# Patient Record
Sex: Male | Born: 1955 | Race: White | Hispanic: No | Marital: Married | Smoking: Never smoker
Health system: Southern US, Community
[De-identification: ages and names within clinical notes are randomized; demographics above are authoritative.]

## PROBLEM LIST (undated history)

## (undated) DIAGNOSIS — I2510 Atherosclerotic heart disease of native coronary artery without angina pectoris: Secondary | ICD-10-CM

## (undated) DIAGNOSIS — R0602 Shortness of breath: Secondary | ICD-10-CM

## (undated) DIAGNOSIS — J479 Bronchiectasis, uncomplicated: Secondary | ICD-10-CM

## (undated) DIAGNOSIS — I219 Acute myocardial infarction, unspecified: Secondary | ICD-10-CM

## (undated) DIAGNOSIS — I209 Angina pectoris, unspecified: Secondary | ICD-10-CM

## (undated) DIAGNOSIS — I10 Essential (primary) hypertension: Secondary | ICD-10-CM

## (undated) DIAGNOSIS — K219 Gastro-esophageal reflux disease without esophagitis: Secondary | ICD-10-CM

## (undated) DIAGNOSIS — G4730 Sleep apnea, unspecified: Secondary | ICD-10-CM

## (undated) DIAGNOSIS — K449 Diaphragmatic hernia without obstruction or gangrene: Secondary | ICD-10-CM

## (undated) DIAGNOSIS — M1990 Unspecified osteoarthritis, unspecified site: Secondary | ICD-10-CM

## (undated) DIAGNOSIS — J449 Chronic obstructive pulmonary disease, unspecified: Secondary | ICD-10-CM

## (undated) DIAGNOSIS — K2270 Barrett's esophagus without dysplasia: Secondary | ICD-10-CM

## (undated) DIAGNOSIS — E785 Hyperlipidemia, unspecified: Secondary | ICD-10-CM

## (undated) DIAGNOSIS — J45909 Unspecified asthma, uncomplicated: Secondary | ICD-10-CM

## (undated) HISTORY — DX: Gastro-esophageal reflux disease without esophagitis: K21.9

## (undated) HISTORY — DX: Sleep apnea, unspecified: G47.30

## (undated) HISTORY — DX: Barrett's esophagus without dysplasia: K22.70

## (undated) HISTORY — DX: Unspecified asthma, uncomplicated: J45.909

## (undated) HISTORY — DX: Atherosclerotic heart disease of native coronary artery without angina pectoris: I25.10

## (undated) HISTORY — DX: Unspecified osteoarthritis, unspecified site: M19.90

## (undated) HISTORY — DX: Acute myocardial infarction, unspecified: I21.9

## (undated) HISTORY — DX: Angina pectoris, unspecified: I20.9

## (undated) HISTORY — PX: CARDIAC CATHETERIZATION: SHX172

## (undated) HISTORY — PX: BACK SURGERY: SHX140

## (undated) HISTORY — DX: Diaphragmatic hernia without obstruction or gangrene: K44.9

## (undated) HISTORY — DX: Chronic obstructive pulmonary disease, unspecified: J44.9

## (undated) HISTORY — DX: Shortness of breath: R06.02

## (undated) HISTORY — PX: FRACTURE SURGERY: SHX138

## (undated) HISTORY — DX: Essential (primary) hypertension: I10

## (undated) HISTORY — PX: OTHER SURGICAL HISTORY: SHX169

## (undated) HISTORY — DX: Hyperlipidemia, unspecified: E78.5

---

## 2001-10-15 ENCOUNTER — Ambulatory Visit (HOSPITAL_COMMUNITY): Admission: RE | Admit: 2001-10-15 | Discharge: 2001-10-15 | Payer: Self-pay | Admitting: *Deleted

## 2001-10-15 ENCOUNTER — Encounter: Payer: Self-pay | Admitting: *Deleted

## 2001-10-27 ENCOUNTER — Encounter: Payer: Self-pay | Admitting: Neurosurgery

## 2001-10-29 ENCOUNTER — Ambulatory Visit (HOSPITAL_COMMUNITY): Admission: RE | Admit: 2001-10-29 | Discharge: 2001-10-30 | Payer: Self-pay | Admitting: Neurosurgery

## 2001-10-29 ENCOUNTER — Encounter: Payer: Self-pay | Admitting: Neurosurgery

## 2002-07-29 ENCOUNTER — Ambulatory Visit (HOSPITAL_COMMUNITY): Admission: RE | Admit: 2002-07-29 | Discharge: 2002-07-29 | Payer: Self-pay | Admitting: Family Medicine

## 2002-07-29 ENCOUNTER — Encounter: Payer: Self-pay | Admitting: Family Medicine

## 2002-08-18 ENCOUNTER — Encounter: Payer: Self-pay | Admitting: Neurosurgery

## 2002-08-18 ENCOUNTER — Ambulatory Visit (HOSPITAL_COMMUNITY): Admission: RE | Admit: 2002-08-18 | Discharge: 2002-08-19 | Payer: Self-pay | Admitting: Neurosurgery

## 2016-12-17 DIAGNOSIS — G4733 Obstructive sleep apnea (adult) (pediatric): Secondary | ICD-10-CM | POA: Diagnosis not present

## 2016-12-17 DIAGNOSIS — J449 Chronic obstructive pulmonary disease, unspecified: Secondary | ICD-10-CM

## 2016-12-17 DIAGNOSIS — Z9989 Dependence on other enabling machines and devices: Secondary | ICD-10-CM | POA: Diagnosis not present

## 2016-12-17 DIAGNOSIS — J479 Bronchiectasis, uncomplicated: Secondary | ICD-10-CM | POA: Diagnosis not present

## 2016-12-17 DIAGNOSIS — R0602 Shortness of breath: Secondary | ICD-10-CM | POA: Diagnosis not present

## 2016-12-17 HISTORY — DX: Chronic obstructive pulmonary disease, unspecified: J44.9

## 2017-01-09 DIAGNOSIS — K219 Gastro-esophageal reflux disease without esophagitis: Secondary | ICD-10-CM | POA: Diagnosis not present

## 2017-01-09 DIAGNOSIS — K2270 Barrett's esophagus without dysplasia: Secondary | ICD-10-CM | POA: Diagnosis not present

## 2017-01-30 DIAGNOSIS — J479 Bronchiectasis, uncomplicated: Secondary | ICD-10-CM | POA: Diagnosis not present

## 2017-01-31 DIAGNOSIS — D126 Benign neoplasm of colon, unspecified: Secondary | ICD-10-CM | POA: Diagnosis not present

## 2017-01-31 DIAGNOSIS — K219 Gastro-esophageal reflux disease without esophagitis: Secondary | ICD-10-CM | POA: Diagnosis not present

## 2017-01-31 DIAGNOSIS — K2270 Barrett's esophagus without dysplasia: Secondary | ICD-10-CM | POA: Diagnosis not present

## 2017-02-05 DIAGNOSIS — R140 Abdominal distension (gaseous): Secondary | ICD-10-CM | POA: Diagnosis not present

## 2017-02-05 DIAGNOSIS — R1013 Epigastric pain: Secondary | ICD-10-CM | POA: Diagnosis not present

## 2017-02-12 DIAGNOSIS — E785 Hyperlipidemia, unspecified: Secondary | ICD-10-CM | POA: Diagnosis not present

## 2017-02-12 DIAGNOSIS — I10 Essential (primary) hypertension: Secondary | ICD-10-CM | POA: Diagnosis not present

## 2017-02-12 DIAGNOSIS — Z01818 Encounter for other preprocedural examination: Secondary | ICD-10-CM | POA: Diagnosis not present

## 2017-02-12 DIAGNOSIS — I2510 Atherosclerotic heart disease of native coronary artery without angina pectoris: Secondary | ICD-10-CM | POA: Diagnosis not present

## 2017-02-15 DIAGNOSIS — Z01818 Encounter for other preprocedural examination: Secondary | ICD-10-CM | POA: Diagnosis not present

## 2017-02-21 DIAGNOSIS — K2270 Barrett's esophagus without dysplasia: Secondary | ICD-10-CM | POA: Diagnosis not present

## 2017-02-21 DIAGNOSIS — K449 Diaphragmatic hernia without obstruction or gangrene: Secondary | ICD-10-CM | POA: Diagnosis not present

## 2017-02-21 DIAGNOSIS — K219 Gastro-esophageal reflux disease without esophagitis: Secondary | ICD-10-CM | POA: Diagnosis not present

## 2017-02-21 DIAGNOSIS — K2970 Gastritis, unspecified, without bleeding: Secondary | ICD-10-CM | POA: Diagnosis not present

## 2017-02-21 DIAGNOSIS — R109 Unspecified abdominal pain: Secondary | ICD-10-CM | POA: Diagnosis not present

## 2017-02-21 DIAGNOSIS — R140 Abdominal distension (gaseous): Secondary | ICD-10-CM | POA: Diagnosis not present

## 2017-03-13 DIAGNOSIS — K589 Irritable bowel syndrome without diarrhea: Secondary | ICD-10-CM | POA: Diagnosis not present

## 2017-03-13 DIAGNOSIS — E782 Mixed hyperlipidemia: Secondary | ICD-10-CM | POA: Diagnosis not present

## 2017-03-13 DIAGNOSIS — J449 Chronic obstructive pulmonary disease, unspecified: Secondary | ICD-10-CM | POA: Diagnosis not present

## 2017-03-13 DIAGNOSIS — G4730 Sleep apnea, unspecified: Secondary | ICD-10-CM | POA: Diagnosis not present

## 2017-03-13 DIAGNOSIS — E119 Type 2 diabetes mellitus without complications: Secondary | ICD-10-CM | POA: Diagnosis not present

## 2017-03-13 DIAGNOSIS — Q324 Other congenital malformations of bronchus: Secondary | ICD-10-CM | POA: Diagnosis not present

## 2017-03-13 DIAGNOSIS — M545 Low back pain: Secondary | ICD-10-CM | POA: Diagnosis not present

## 2017-03-13 DIAGNOSIS — E559 Vitamin D deficiency, unspecified: Secondary | ICD-10-CM | POA: Diagnosis not present

## 2017-03-13 DIAGNOSIS — Z125 Encounter for screening for malignant neoplasm of prostate: Secondary | ICD-10-CM | POA: Diagnosis not present

## 2017-03-13 DIAGNOSIS — J301 Allergic rhinitis due to pollen: Secondary | ICD-10-CM | POA: Diagnosis not present

## 2017-03-13 DIAGNOSIS — Z6829 Body mass index (BMI) 29.0-29.9, adult: Secondary | ICD-10-CM | POA: Diagnosis not present

## 2017-03-13 DIAGNOSIS — I2510 Atherosclerotic heart disease of native coronary artery without angina pectoris: Secondary | ICD-10-CM | POA: Diagnosis not present

## 2017-04-30 DIAGNOSIS — M546 Pain in thoracic spine: Secondary | ICD-10-CM | POA: Diagnosis not present

## 2017-04-30 DIAGNOSIS — Z981 Arthrodesis status: Secondary | ICD-10-CM | POA: Diagnosis not present

## 2017-04-30 DIAGNOSIS — Z6829 Body mass index (BMI) 29.0-29.9, adult: Secondary | ICD-10-CM | POA: Diagnosis not present

## 2017-04-30 DIAGNOSIS — M545 Low back pain: Secondary | ICD-10-CM | POA: Diagnosis not present

## 2017-04-30 DIAGNOSIS — E663 Overweight: Secondary | ICD-10-CM | POA: Diagnosis not present

## 2017-04-30 DIAGNOSIS — M47894 Other spondylosis, thoracic region: Secondary | ICD-10-CM | POA: Diagnosis not present

## 2017-07-22 DIAGNOSIS — K219 Gastro-esophageal reflux disease without esophagitis: Secondary | ICD-10-CM | POA: Diagnosis not present

## 2017-07-22 DIAGNOSIS — K2270 Barrett's esophagus without dysplasia: Secondary | ICD-10-CM | POA: Diagnosis not present

## 2017-07-30 DIAGNOSIS — J301 Allergic rhinitis due to pollen: Secondary | ICD-10-CM | POA: Diagnosis not present

## 2017-07-30 DIAGNOSIS — M545 Low back pain: Secondary | ICD-10-CM | POA: Diagnosis not present

## 2017-07-30 DIAGNOSIS — E119 Type 2 diabetes mellitus without complications: Secondary | ICD-10-CM | POA: Diagnosis not present

## 2017-07-30 DIAGNOSIS — Z6829 Body mass index (BMI) 29.0-29.9, adult: Secondary | ICD-10-CM | POA: Diagnosis not present

## 2017-07-30 DIAGNOSIS — E782 Mixed hyperlipidemia: Secondary | ICD-10-CM | POA: Diagnosis not present

## 2017-07-30 DIAGNOSIS — I2510 Atherosclerotic heart disease of native coronary artery without angina pectoris: Secondary | ICD-10-CM | POA: Diagnosis not present

## 2017-07-30 DIAGNOSIS — I10 Essential (primary) hypertension: Secondary | ICD-10-CM | POA: Diagnosis not present

## 2017-07-30 DIAGNOSIS — J45909 Unspecified asthma, uncomplicated: Secondary | ICD-10-CM | POA: Diagnosis not present

## 2017-08-08 DIAGNOSIS — R1011 Right upper quadrant pain: Secondary | ICD-10-CM | POA: Diagnosis not present

## 2017-08-21 DIAGNOSIS — K219 Gastro-esophageal reflux disease without esophagitis: Secondary | ICD-10-CM | POA: Diagnosis not present

## 2017-08-21 DIAGNOSIS — K2270 Barrett's esophagus without dysplasia: Secondary | ICD-10-CM | POA: Diagnosis not present

## 2017-08-29 ENCOUNTER — Other Ambulatory Visit: Payer: Self-pay | Admitting: Cardiology

## 2017-09-04 DIAGNOSIS — R109 Unspecified abdominal pain: Secondary | ICD-10-CM | POA: Diagnosis not present

## 2017-09-04 DIAGNOSIS — I700 Atherosclerosis of aorta: Secondary | ICD-10-CM | POA: Diagnosis not present

## 2017-09-18 DIAGNOSIS — K5900 Constipation, unspecified: Secondary | ICD-10-CM | POA: Diagnosis not present

## 2017-09-18 DIAGNOSIS — R1032 Left lower quadrant pain: Secondary | ICD-10-CM | POA: Diagnosis not present

## 2017-10-08 DIAGNOSIS — Z6830 Body mass index (BMI) 30.0-30.9, adult: Secondary | ICD-10-CM | POA: Diagnosis not present

## 2017-10-08 DIAGNOSIS — E782 Mixed hyperlipidemia: Secondary | ICD-10-CM | POA: Diagnosis not present

## 2017-10-08 DIAGNOSIS — J45909 Unspecified asthma, uncomplicated: Secondary | ICD-10-CM | POA: Diagnosis not present

## 2017-10-08 DIAGNOSIS — I2510 Atherosclerotic heart disease of native coronary artery without angina pectoris: Secondary | ICD-10-CM | POA: Diagnosis not present

## 2017-10-08 DIAGNOSIS — J189 Pneumonia, unspecified organism: Secondary | ICD-10-CM | POA: Diagnosis not present

## 2017-10-08 DIAGNOSIS — M545 Low back pain: Secondary | ICD-10-CM | POA: Diagnosis not present

## 2017-10-14 DIAGNOSIS — G4733 Obstructive sleep apnea (adult) (pediatric): Secondary | ICD-10-CM | POA: Diagnosis not present

## 2017-10-17 DIAGNOSIS — G4733 Obstructive sleep apnea (adult) (pediatric): Secondary | ICD-10-CM | POA: Diagnosis not present

## 2017-10-28 DIAGNOSIS — J470 Bronchiectasis with acute lower respiratory infection: Secondary | ICD-10-CM | POA: Diagnosis not present

## 2017-10-28 DIAGNOSIS — J449 Chronic obstructive pulmonary disease, unspecified: Secondary | ICD-10-CM | POA: Diagnosis not present

## 2017-10-28 DIAGNOSIS — Z9989 Dependence on other enabling machines and devices: Secondary | ICD-10-CM | POA: Diagnosis not present

## 2017-10-28 DIAGNOSIS — G4733 Obstructive sleep apnea (adult) (pediatric): Secondary | ICD-10-CM | POA: Diagnosis not present

## 2017-11-04 DIAGNOSIS — J471 Bronchiectasis with (acute) exacerbation: Secondary | ICD-10-CM | POA: Diagnosis not present

## 2017-11-04 DIAGNOSIS — R531 Weakness: Secondary | ICD-10-CM | POA: Diagnosis not present

## 2017-11-04 DIAGNOSIS — I700 Atherosclerosis of aorta: Secondary | ICD-10-CM | POA: Diagnosis not present

## 2017-11-04 DIAGNOSIS — R05 Cough: Secondary | ICD-10-CM | POA: Diagnosis not present

## 2017-11-04 DIAGNOSIS — I2510 Atherosclerotic heart disease of native coronary artery without angina pectoris: Secondary | ICD-10-CM | POA: Diagnosis not present

## 2017-11-04 DIAGNOSIS — Z8709 Personal history of other diseases of the respiratory system: Secondary | ICD-10-CM | POA: Diagnosis not present

## 2017-11-04 DIAGNOSIS — J441 Chronic obstructive pulmonary disease with (acute) exacerbation: Secondary | ICD-10-CM | POA: Diagnosis not present

## 2017-11-04 DIAGNOSIS — R0789 Other chest pain: Secondary | ICD-10-CM | POA: Diagnosis not present

## 2017-11-04 DIAGNOSIS — R0602 Shortness of breath: Secondary | ICD-10-CM | POA: Diagnosis not present

## 2017-11-04 DIAGNOSIS — R062 Wheezing: Secondary | ICD-10-CM | POA: Diagnosis not present

## 2017-11-04 DIAGNOSIS — R079 Chest pain, unspecified: Secondary | ICD-10-CM | POA: Diagnosis not present

## 2017-11-04 DIAGNOSIS — J9801 Acute bronchospasm: Secondary | ICD-10-CM | POA: Diagnosis not present

## 2017-11-04 DIAGNOSIS — I252 Old myocardial infarction: Secondary | ICD-10-CM | POA: Diagnosis not present

## 2017-11-04 DIAGNOSIS — R0600 Dyspnea, unspecified: Secondary | ICD-10-CM | POA: Diagnosis not present

## 2017-11-04 DIAGNOSIS — Z8679 Personal history of other diseases of the circulatory system: Secondary | ICD-10-CM | POA: Diagnosis not present

## 2017-11-11 DIAGNOSIS — J471 Bronchiectasis with (acute) exacerbation: Secondary | ICD-10-CM | POA: Diagnosis not present

## 2017-11-11 DIAGNOSIS — J9801 Acute bronchospasm: Secondary | ICD-10-CM | POA: Diagnosis not present

## 2017-11-11 DIAGNOSIS — J449 Chronic obstructive pulmonary disease, unspecified: Secondary | ICD-10-CM | POA: Diagnosis not present

## 2017-11-22 DIAGNOSIS — Z9989 Dependence on other enabling machines and devices: Secondary | ICD-10-CM | POA: Diagnosis not present

## 2017-11-22 DIAGNOSIS — G4733 Obstructive sleep apnea (adult) (pediatric): Secondary | ICD-10-CM | POA: Diagnosis not present

## 2017-11-22 DIAGNOSIS — J471 Bronchiectasis with (acute) exacerbation: Secondary | ICD-10-CM | POA: Diagnosis not present

## 2017-11-22 DIAGNOSIS — J449 Chronic obstructive pulmonary disease, unspecified: Secondary | ICD-10-CM | POA: Diagnosis not present

## 2017-12-12 DIAGNOSIS — J45909 Unspecified asthma, uncomplicated: Secondary | ICD-10-CM | POA: Diagnosis not present

## 2017-12-12 DIAGNOSIS — I2510 Atherosclerotic heart disease of native coronary artery without angina pectoris: Secondary | ICD-10-CM | POA: Diagnosis not present

## 2017-12-12 DIAGNOSIS — Z6830 Body mass index (BMI) 30.0-30.9, adult: Secondary | ICD-10-CM | POA: Diagnosis not present

## 2017-12-12 DIAGNOSIS — G4730 Sleep apnea, unspecified: Secondary | ICD-10-CM | POA: Diagnosis not present

## 2017-12-12 DIAGNOSIS — I10 Essential (primary) hypertension: Secondary | ICD-10-CM | POA: Diagnosis not present

## 2017-12-12 DIAGNOSIS — Q324 Other congenital malformations of bronchus: Secondary | ICD-10-CM | POA: Diagnosis not present

## 2017-12-12 DIAGNOSIS — E782 Mixed hyperlipidemia: Secondary | ICD-10-CM | POA: Diagnosis not present

## 2017-12-12 DIAGNOSIS — E669 Obesity, unspecified: Secondary | ICD-10-CM | POA: Diagnosis not present

## 2017-12-12 DIAGNOSIS — M545 Low back pain: Secondary | ICD-10-CM | POA: Diagnosis not present

## 2017-12-12 DIAGNOSIS — E119 Type 2 diabetes mellitus without complications: Secondary | ICD-10-CM | POA: Diagnosis not present

## 2017-12-12 DIAGNOSIS — Z79899 Other long term (current) drug therapy: Secondary | ICD-10-CM | POA: Diagnosis not present

## 2017-12-12 DIAGNOSIS — E559 Vitamin D deficiency, unspecified: Secondary | ICD-10-CM | POA: Diagnosis not present

## 2017-12-12 DIAGNOSIS — R079 Chest pain, unspecified: Secondary | ICD-10-CM | POA: Diagnosis not present

## 2017-12-17 ENCOUNTER — Ambulatory Visit: Payer: PPO | Admitting: Cardiology

## 2017-12-17 ENCOUNTER — Encounter: Payer: Self-pay | Admitting: Cardiology

## 2017-12-17 ENCOUNTER — Other Ambulatory Visit: Payer: Self-pay

## 2017-12-17 ENCOUNTER — Ambulatory Visit (HOSPITAL_BASED_OUTPATIENT_CLINIC_OR_DEPARTMENT_OTHER)
Admission: RE | Admit: 2017-12-17 | Discharge: 2017-12-17 | Disposition: A | Discharge status: Home / Self Care | Payer: PPO | Source: Ambulatory Visit | Attending: Cardiology | Admitting: Cardiology

## 2017-12-17 VITALS — BP 136/82 | HR 76 | Ht 72.0 in | Wt 221.1 lb

## 2017-12-17 DIAGNOSIS — M1990 Unspecified osteoarthritis, unspecified site: Secondary | ICD-10-CM | POA: Diagnosis not present

## 2017-12-17 DIAGNOSIS — G4730 Sleep apnea, unspecified: Secondary | ICD-10-CM | POA: Insufficient documentation

## 2017-12-17 DIAGNOSIS — I219 Acute myocardial infarction, unspecified: Secondary | ICD-10-CM | POA: Diagnosis not present

## 2017-12-17 DIAGNOSIS — I209 Angina pectoris, unspecified: Secondary | ICD-10-CM

## 2017-12-17 DIAGNOSIS — M5134 Other intervertebral disc degeneration, thoracic region: Secondary | ICD-10-CM | POA: Diagnosis not present

## 2017-12-17 DIAGNOSIS — I25119 Atherosclerotic heart disease of native coronary artery with unspecified angina pectoris: Secondary | ICD-10-CM | POA: Diagnosis not present

## 2017-12-17 DIAGNOSIS — J45909 Unspecified asthma, uncomplicated: Secondary | ICD-10-CM | POA: Diagnosis not present

## 2017-12-17 DIAGNOSIS — J449 Chronic obstructive pulmonary disease, unspecified: Secondary | ICD-10-CM | POA: Diagnosis not present

## 2017-12-17 DIAGNOSIS — R0602 Shortness of breath: Secondary | ICD-10-CM | POA: Diagnosis not present

## 2017-12-17 DIAGNOSIS — I10 Essential (primary) hypertension: Secondary | ICD-10-CM | POA: Diagnosis not present

## 2017-12-17 DIAGNOSIS — Z01818 Encounter for other preprocedural examination: Secondary | ICD-10-CM | POA: Diagnosis not present

## 2017-12-17 DIAGNOSIS — K2270 Barrett's esophagus without dysplasia: Secondary | ICD-10-CM | POA: Insufficient documentation

## 2017-12-17 DIAGNOSIS — E785 Hyperlipidemia, unspecified: Secondary | ICD-10-CM | POA: Diagnosis not present

## 2017-12-17 DIAGNOSIS — I700 Atherosclerosis of aorta: Secondary | ICD-10-CM | POA: Insufficient documentation

## 2017-12-17 DIAGNOSIS — I252 Old myocardial infarction: Secondary | ICD-10-CM | POA: Insufficient documentation

## 2017-12-17 DIAGNOSIS — I2510 Atherosclerotic heart disease of native coronary artery without angina pectoris: Secondary | ICD-10-CM | POA: Diagnosis not present

## 2017-12-17 DIAGNOSIS — I200 Unstable angina: Secondary | ICD-10-CM | POA: Insufficient documentation

## 2017-12-17 DIAGNOSIS — K449 Diaphragmatic hernia without obstruction or gangrene: Secondary | ICD-10-CM | POA: Insufficient documentation

## 2017-12-17 NOTE — Patient Instructions (Addendum)
Medication Instructions:  Your physician recommends that you continue on your current medications as directed. Please refer to the Current Medication list given to you today.  Labwork: Your physician recommends that you have the following labs drawn: CBC, BMP and PT/INR  Testing/Procedures: A chest x-ray takes a picture of the organs and structures inside the chest, including the heart, lungs, and blood vessels. This test can show several things, including, whether the heart is enlarges; whether fluid is building up in the lungs; and whether pacemaker / defibrillator leads are still in place.    [place] [address] [place] [address] Dept: [phone] Loc: [place]  12/17/2017  You are scheduled for a Cardiac Catheterization on Friday, January 11 with Dr. Peter Martinique.  1. Please arrive at the [facility] (Main Entrance A) at [facility]: [address] [place], [place] [address] at 8:00 AM (two hours before your procedure to ensure your preparation). Free valet parking service is available.   Special note: Every effort is made to have your procedure done on time. Please understand that emergencies sometimes delay scheduled procedures.  2. Diet: Do not eat or drink anything after midnight prior to your procedure except sips of water to take medications.  3. Labs: Done today  4. Medication instructions in preparation for your procedure:  On the morning of your procedure, take your Aspirin and any morning medicines NOT listed above.  You may use sips of water.  5. Plan for one night stay--bring personal belongings. 6. Bring a current list of your medications and current insurance cards. 7. You MUST have a responsible person to drive you home. 8. Someone MUST be with you the first 24 hours after you arrive home or your discharge will be delayed. 9. Please  wear clothes that are easy to get on and off and wear slip-on shoes.  Thank you for allowing [place] to care for you!   -- [facility]   Follow-Up: Your physician recommends that you schedule a follow-up appointment in: 1 month  Any Other Special Instructions Will Be Listed Below (If Applicable).     If you need a refill on your cardiac medications before your next appointment, please call your pharmacy.   [place], RN, BSN    Coronary Angiogram With Stent Coronary angiogram with stent placement is a procedure to widen or open a narrow blood vessel of the heart (coronary artery). Arteries may become blocked by cholesterol buildup (plaques) in the lining of the wall. When a coronary artery becomes partially blocked, blood flow to that area decreases. This may lead to chest pain or a heart attack (myocardial infarction). A stent is a small piece of metal that looks like mesh or a spring. Stent placement may be done as treatment for a heart attack or right after a coronary angiogram in which a blocked artery is found. Let your health care provider know about:  Any allergies you have.  All medicines you are taking, including vitamins, herbs, eye drops, creams, and over-the-counter medicines.  Any problems you or family members have had with anesthetic medicines.  Any blood disorders you have.  Any surgeries you have had.  Any medical conditions you have.  Whether you are pregnant or may be pregnant. What are the risks? Generally, this is a safe procedure. However, problems may occur, including:  Damage to the heart or its blood vessels.  A return of blockage.  Bleeding, infection, or bruising at the insertion site.  A collection of blood under the skin (hematoma) at the insertion site.  A blood clot in another part of the body.  Kidney injury.  Allergic reaction to the dye or contrast that is used.  Bleeding into the  abdomen (retroperitoneal bleeding).  What happens before the procedure? Staying hydrated Follow instructions from your health care provider about hydration, which may include:  Up to 2 hours before the procedure - you may continue to drink clear liquids, such as water, clear fruit juice, black coffee, and plain tea.  Eating and drinking restrictions Follow instructions from your health care provider about eating and drinking, which may include:  8 hours before the procedure - stop eating heavy meals or foods such as meat, fried foods, or fatty foods.  6 hours before the procedure - stop eating light meals or foods, such as toast or cereal.  2 hours before the procedure - stop drinking clear liquids.  Ask your health care provider about:  Changing or stopping your regular medicines. This is especially important if you are taking diabetes medicines or blood thinners.  Taking medicines such as ibuprofen. These medicines can thin your blood. Do not take these medicines before your procedure if your health care provider instructs you not to. Generally, aspirin is recommended before a procedure of passing a small, thin tube (catheter) through a blood vessel and into the heart (cardiac catheterization).  What happens during the procedure?  An IV tube will be inserted into one of your veins.  You will be given one or more of the following: ? A medicine to help you relax (sedative). ? A medicine to numb the area where the catheter will be inserted into an artery (local anesthetic).  To reduce your risk of infection: ? Your health care team will wash or sanitize their hands. ? Your skin will be washed with soap. ? Hair may be removed from the area where the catheter will be inserted.  Using a guide wire, the catheter will be inserted into an artery. The location may be in your groin, in your wrist, or in the fold of your arm (near your elbow).  A type of X-ray (fluoroscopy) will be used  to help guide the catheter to the opening of the arteries in the heart.  A dye will be injected into the catheter, and X-rays will be taken. The dye will help to show where any narrowing or blockages are located in the arteries.  A tiny wire will be guided to the blocked spot, and a balloon will be inflated to make the artery wider.  The stent will be expanded and will crush the plaques into the wall of the vessel. The stent will hold the area open and improve the blood flow. Most stents have a drug coating to reduce the risk of the stent narrowing over time.  The artery may be made wider using a drill, laser, or other tools to remove plaques.  When the blood flow is better, the catheter will be removed. The lining of the artery will grow over the stent, which stays where it was placed. This procedure may vary among health care providers and hospitals. What happens after the procedure?  If the procedure is done through the leg, you will be kept in bed lying flat for about 6 hours. You will be instructed to not bend  and not cross your legs.  The insertion site will be checked frequently.  The pulse in your foot or wrist will be checked frequently.  You may have additional blood tests, X-rays, and a test that records the electrical activity of your heart (electrocardiogram, or ECG). This information is not intended to replace advice given to you by your health care provider. Make sure you discuss any questions you have with your health care provider. Document Released: 06/02/2003 Document Revised: 07/26/2016 Document Reviewed: 07/01/2016 Elsevier Interactive Patient Education  [employer].

## 2017-12-17 NOTE — H&P (View-Only) (Signed)
Cardiology Office Note:    Date:  12/17/2017   ID:  Nicholas Love, DOB 1955-12-26, MRN [id number]  PCP:  Ocie Doyne., MD  Cardiologist:  Jenean Lindau, MD   Referring MD: Ocie Doyne., MD    ASSESSMENT:    1. Angina pectoris ([place])   2. Arthritis   3. Asthma, unspecified asthma severity, unspecified whether complicated, unspecified whether persistent   4. Chronic obstructive pulmonary disease, unspecified COPD type ([place])   5. Hyperlipidemia, unspecified hyperlipidemia type   6. Hypertension, unspecified type   7. Myocardial infarction, unspecified MI type, unspecified artery ([place])   8. Sleep apnea, unspecified type   9. SOB (shortness of breath)   10. Coronary artery disease, angina presence unspecified, unspecified vessel or lesion type, unspecified whether native or transplanted heart    PLAN:    In order of problems listed above:  1. I discussed my findings with the patient at extensive length and secondary prevention stressed. 2. Sublingual nitroglycerin prescription was sent, its protocol and 911 protocol explained and the patient vocalized understanding questions were answered to the patient's satisfaction 3. I discussed coronary angiography and left heart catheterization with the patient at extensive length. Procedure, benefits and potential risks were explained. Patient had multiple questions which were answered to the patient's satisfaction. Patient agreed and consented for the procedure. Further recommendations will be made based on the findings of the coronary angiography. In the interim. The patient has any significant symptoms he knows to go to the nearest emergency room. 4. Lipids are followed by primary care physician.  His blood pressure is stable.  He will be seen in follow-up appointment after coronary angiography.   Medication Adjustments/Labs and Tests Ordered: Current medicines are reviewed at length with the patient today.  Concerns regarding medicines  are outlined above.  Orders Placed This Encounter  Procedures  . EKG 12-Lead   No orders of the defined types were placed in this encounter.    History of Present Illness:    Nicholas Love is a 62 y.o. male who is being seen today for the evaluation of chest pain suggesting angina pectoris at the request of Ocie Doyne., MD.  Patient is a pleasant 62 year old male.  He has past medical history of coronary artery disease and bronchiectasis.  He mentions to me that he had chest tightness recently on 2 or 3 occasions which was relieved with sublingual nitroglycerin.  I reviewed the entire coronary angiography report from 2017.  He underwent coronary stenting and was found to have a borderline stenosis in the right coronary artery and medical treatment was recommended.  Subsequently he has done fine until recently when he started experiencing chest tightness.  This radiates to the neck.  No orthopnea or PND.  It occurs with exertion.  At the time of my evaluation, the patient is alert awake oriented and in no distress.  The patient is accompanied by his wife for this visit.  Past Medical History:  Diagnosis Date  . Angina pectoris ([place])   . Arthritis   . Asthma   . Barrett esophagus   . CAD (coronary artery disease)   . COPD (chronic obstructive pulmonary disease) ([place])   . GERD (gastroesophageal reflux disease)   . Hiatal hernia   . Hyperlipemia   . Hypertension   . Myocardial infarct ([place])   . Sleep apnea   . SOB (shortness of breath)      Current Medications: Current Meds  Medication Sig  .  albuterol (PROVENTIL) (2.5 MG/3ML) 0.083% nebulizer solution Take 3 mLs by nebulization every 4 (four) hours as needed.  Marland Kitchen aspirin EC 81 MG tablet Take 1 tablet by mouth daily.  Marland Kitchen BREO ELLIPTA 100-25 MCG/INH AEPB   . clopidogrel (PLAVIX) 75 MG tablet Take 1 tablet by mouth daily.  Marland Kitchen losartan-hydrochlorothiazide (HYZAAR) 100-12.5 MG tablet Take 1 tablet by mouth daily.  . metoprolol  succinate (TOPROL-XL) 100 MG 24 hr tablet Take 1 tablet by mouth daily.  . nitroGLYCERIN (NITROSTAT) 0.4 MG SL tablet 1 TABLET UNDER THE TONGUE AS NEEDED FOR CHEST PAIN UP TO 3 TABS, IF NO RESULT CALL 911  . oxyCODONE (OXYCONTIN) 10 mg 12 hr tablet Take 1 tablet by mouth every 8 (eight) hours as needed.  . pantoprazole (PROTONIX) 40 MG tablet Take 1 tablet by mouth daily.  . rosuvastatin (CRESTOR) 40 MG tablet Take 1 tablet by mouth daily.  Marland Kitchen SPIRIVA RESPIMAT 1.25 MCG/ACT AERS   . ST JOHNS WORT PO Take by mouth.  Marland Kitchen tiZANidine (ZANAFLEX) 4 MG tablet Take by mouth.  . vitamin B-12 (CYANOCOBALAMIN) 1000 MCG tablet Take 1,000 mcg by mouth daily.     Allergies:   Ceftriaxone; Cefdinir; and Ciprofloxacin   Social History   Socioeconomic History  . Marital status: Married    Spouse name: None  . Number of children: None  . Years of education: None  . Highest education level: None  Social Needs  . Financial resource strain: None  . Food insecurity - worry: None  . Food insecurity - inability: None  . Transportation needs - medical: None  . Transportation needs - non-medical: None  Occupational History  . None  Tobacco Use  . Smoking status: None  Substance and Sexual Activity  . Alcohol use: None  . Drug use: None  . Sexual activity: None  Other Topics Concern  . None  Social History Narrative  . None     Family History: The patient's family history is not on file.  ROS:   Please see the history of present illness.    All other systems reviewed and are negative.  EKGs/Labs/Other Studies Reviewed:    The following studies were reviewed today: I reviewed coronary angiography report 2017.  EKG donerevealed sinus rhythm and nonspecific ST-T changes   Recent Labs: No results found for requested labs within last 8760 hours.  Recent Lipid Panel No results found for: CHOL, TRIG, HDL, CHOLHDL, VLDL, LDLCALC, LDLDIRECT  Physical Exam:    VS:  BP 136/82 (BP Location: Right  Arm, Patient Position: Sitting, Cuff Size: Large)   Pulse 76   Ht 6' (1.829 m)   Wt 221 lb 1.3 oz (100.3 kg)   SpO2 95%   BMI 29.98 kg/m     Wt Readings from Last 3 Encounters:  12/17/17 221 lb 1.3 oz (100.3 kg)     GEN: Patient is in no acute distress HEENT: Normal NECK: No JVD; No carotid bruits LYMPHATICS: No lymphadenopathy CARDIAC: S1 S2 regular, 2/6 systolic murmur at the apex. RESPIRATORY:  Clear to auscultation without rales, wheezing or rhonchi  ABDOMEN: Soft, non-tender, non-distended MUSCULOSKELETAL:  No edema; No deformity  SKIN: Warm and dry NEUROLOGIC:  Alert and oriented x 3 PSYCHIATRIC:  Normal affect    Signed, Jenean Lindau, MD  12/17/2017 10:38 AM    [place]

## 2017-12-17 NOTE — Progress Notes (Signed)
Cardiology Office Note:    Date:  12/17/2017   ID:  Nicholas Love, DOB 16-May-1956, MRN [id number]  PCP:  Ocie Doyne., MD  Cardiologist:  Jenean Lindau, MD   Referring MD: Ocie Doyne., MD    ASSESSMENT:    1. Angina pectoris ([place])   2. Arthritis   3. Asthma, unspecified asthma severity, unspecified whether complicated, unspecified whether persistent   4. Chronic obstructive pulmonary disease, unspecified COPD type ([place])   5. Hyperlipidemia, unspecified hyperlipidemia type   6. Hypertension, unspecified type   7. Myocardial infarction, unspecified MI type, unspecified artery ([place])   8. Sleep apnea, unspecified type   9. SOB (shortness of breath)   10. Coronary artery disease, angina presence unspecified, unspecified vessel or lesion type, unspecified whether native or transplanted heart    PLAN:    In order of problems listed above:  1. I discussed my findings with the patient at extensive length and secondary prevention stressed. 2. Sublingual nitroglycerin prescription was sent, its protocol and 911 protocol explained and the patient vocalized understanding questions were answered to the patient's satisfaction 3. I discussed coronary angiography and left heart catheterization with the patient at extensive length. Procedure, benefits and potential risks were explained. Patient had multiple questions which were answered to the patient's satisfaction. Patient agreed and consented for the procedure. Further recommendations will be made based on the findings of the coronary angiography. In the interim. The patient has any significant symptoms he knows to go to the nearest emergency room. 4. Lipids are followed by primary care physician.  His blood pressure is stable.  He will be seen in follow-up appointment after coronary angiography.   Medication Adjustments/Labs and Tests Ordered: Current medicines are reviewed at length with the patient today.  Concerns regarding medicines  are outlined above.  Orders Placed This Encounter  Procedures  . EKG 12-Lead   No orders of the defined types were placed in this encounter.    History of Present Illness:    Nicholas Love is a 62 y.o. male who is being seen today for the evaluation of chest pain suggesting angina pectoris at the request of Ocie Doyne., MD.  Patient is a pleasant 62 year old male.  He has past medical history of coronary artery disease and bronchiectasis.  He mentions to me that he had chest tightness recently on 2 or 3 occasions which was relieved with sublingual nitroglycerin.  I reviewed the entire coronary angiography report from 2017.  He underwent coronary stenting and was found to have a borderline stenosis in the right coronary artery and medical treatment was recommended.  Subsequently he has done fine until recently when he started experiencing chest tightness.  This radiates to the neck.  No orthopnea or PND.  It occurs with exertion.  At the time of my evaluation, the patient is alert awake oriented and in no distress.  The patient is accompanied by his wife for this visit.  Past Medical History:  Diagnosis Date  . Angina pectoris ([place])   . Arthritis   . Asthma   . Barrett esophagus   . CAD (coronary artery disease)   . COPD (chronic obstructive pulmonary disease) ([place])   . GERD (gastroesophageal reflux disease)   . Hiatal hernia   . Hyperlipemia   . Hypertension   . Myocardial infarct ([place])   . Sleep apnea   . SOB (shortness of breath)      Current Medications: Current Meds  Medication Sig  .  albuterol (PROVENTIL) (2.5 MG/3ML) 0.083% nebulizer solution Take 3 mLs by nebulization every 4 (four) hours as needed.  Marland Kitchen aspirin EC 81 MG tablet Take 1 tablet by mouth daily.  Marland Kitchen BREO ELLIPTA 100-25 MCG/INH AEPB   . clopidogrel (PLAVIX) 75 MG tablet Take 1 tablet by mouth daily.  Marland Kitchen losartan-hydrochlorothiazide (HYZAAR) 100-12.5 MG tablet Take 1 tablet by mouth daily.  . metoprolol  succinate (TOPROL-XL) 100 MG 24 hr tablet Take 1 tablet by mouth daily.  . nitroGLYCERIN (NITROSTAT) 0.4 MG SL tablet 1 TABLET UNDER THE TONGUE AS NEEDED FOR CHEST PAIN UP TO 3 TABS, IF NO RESULT CALL 911  . oxyCODONE (OXYCONTIN) 10 mg 12 hr tablet Take 1 tablet by mouth every 8 (eight) hours as needed.  . pantoprazole (PROTONIX) 40 MG tablet Take 1 tablet by mouth daily.  . rosuvastatin (CRESTOR) 40 MG tablet Take 1 tablet by mouth daily.  Marland Kitchen SPIRIVA RESPIMAT 1.25 MCG/ACT AERS   . ST JOHNS WORT PO Take by mouth.  Marland Kitchen tiZANidine (ZANAFLEX) 4 MG tablet Take by mouth.  . vitamin B-12 (CYANOCOBALAMIN) 1000 MCG tablet Take 1,000 mcg by mouth daily.     Allergies:   Ceftriaxone; Cefdinir; and Ciprofloxacin   Social History   Socioeconomic History  . Marital status: Married    Spouse name: None  . Number of children: None  . Years of education: None  . Highest education level: None  Social Needs  . Financial resource strain: None  . Food insecurity - worry: None  . Food insecurity - inability: None  . Transportation needs - medical: None  . Transportation needs - non-medical: None  Occupational History  . None  Tobacco Use  . Smoking status: None  Substance and Sexual Activity  . Alcohol use: None  . Drug use: None  . Sexual activity: None  Other Topics Concern  . None  Social History Narrative  . None     Family History: The patient's family history is not on file.  ROS:   Please see the history of present illness.    All other systems reviewed and are negative.  EKGs/Labs/Other Studies Reviewed:    The following studies were reviewed today: I reviewed coronary angiography report 2017.  EKG donerevealed sinus rhythm and nonspecific ST-T changes   Recent Labs: No results found for requested labs within last 8760 hours.  Recent Lipid Panel No results found for: CHOL, TRIG, HDL, CHOLHDL, VLDL, LDLCALC, LDLDIRECT  Physical Exam:    VS:  BP 136/82 (BP Location: Right  Arm, Patient Position: Sitting, Cuff Size: Large)   Pulse 76   Ht 6' (1.829 m)   Wt 221 lb 1.3 oz (100.3 kg)   SpO2 95%   BMI 29.98 kg/m     Wt Readings from Last 3 Encounters:  12/17/17 221 lb 1.3 oz (100.3 kg)     GEN: Patient is in no acute distress HEENT: Normal NECK: No JVD; No carotid bruits LYMPHATICS: No lymphadenopathy CARDIAC: S1 S2 regular, 2/6 systolic murmur at the apex. RESPIRATORY:  Clear to auscultation without rales, wheezing or rhonchi  ABDOMEN: Soft, non-tender, non-distended MUSCULOSKELETAL:  No edema; No deformity  SKIN: Warm and dry NEUROLOGIC:  Alert and oriented x 3 PSYCHIATRIC:  Normal affect    Signed, Jenean Lindau, MD  12/17/2017 10:38 AM    [place]

## 2017-12-18 ENCOUNTER — Telehealth: Payer: Self-pay

## 2017-12-18 LAB — BASIC METABOLIC PANEL
BUN / CREAT RATIO: 23 (ref 10–24)
BUN: 24 mg/dL (ref 8–27)
CALCIUM: 9.8 mg/dL (ref 8.6–10.2)
CHLORIDE: 101 mmol/L (ref 96–106)
CO2: 26 mmol/L (ref 20–29)
Creatinine, Ser: 1.06 mg/dL (ref 0.76–1.27)
GFR calc non Af Amer: 75 mL/min/{1.73_m2} (ref >59)
GFR, EST AFRICAN AMERICAN: 87 mL/min/{1.73_m2} (ref >59)
Glucose: 106 mg/dL — ABNORMAL HIGH (ref 65–99)
POTASSIUM: 4.9 mmol/L (ref 3.5–5.2)
Sodium: 143 mmol/L (ref 134–144)

## 2017-12-18 LAB — PROTIME-INR
INR: 1 (ref 0.8–1.2)
Prothrombin Time: 10.6 s (ref 9.1–12.0)

## 2017-12-18 NOTE — Telephone Encounter (Signed)
Patient's wife called to inquire about the heart cath. Information regarding prep and post were given.

## 2017-12-19 ENCOUNTER — Encounter: Payer: Self-pay | Admitting: Cardiology

## 2017-12-19 ENCOUNTER — Telehealth: Payer: Self-pay

## 2017-12-19 NOTE — Telephone Encounter (Signed)
Patient contacted pre-catheterization at [facility] scheduled for:  12/20/2017 @ 1030 Verified arrival time and place:  [facility] @ 0800 Confirmed AM meds to be taken pre-cath with sip of water: Take ASA/Plavix Hold losartan/hctz Confirmed patient has responsible person to drive home post procedure and observe patient for 24 hours:  yes Addl concerns:  none

## 2017-12-20 ENCOUNTER — Encounter (HOSPITAL_COMMUNITY)
Admission: RE | Disposition: A | Discharge status: Home / Self Care | Payer: Self-pay | Source: Ambulatory Visit | Attending: Cardiology

## 2017-12-20 ENCOUNTER — Observation Stay (HOSPITAL_COMMUNITY)
Admission: RE | Admit: 2017-12-20 | Discharge: 2017-12-21 | Disposition: A | Discharge status: Home / Self Care | Payer: PPO | Source: Ambulatory Visit | Attending: Cardiology | Admitting: Cardiology

## 2017-12-20 ENCOUNTER — Encounter (HOSPITAL_COMMUNITY): Payer: Self-pay | Admitting: Physician Assistant

## 2017-12-20 DIAGNOSIS — I2510 Atherosclerotic heart disease of native coronary artery without angina pectoris: Secondary | ICD-10-CM | POA: Diagnosis not present

## 2017-12-20 DIAGNOSIS — I252 Old myocardial infarction: Secondary | ICD-10-CM | POA: Insufficient documentation

## 2017-12-20 DIAGNOSIS — G4730 Sleep apnea, unspecified: Secondary | ICD-10-CM | POA: Insufficient documentation

## 2017-12-20 DIAGNOSIS — K2270 Barrett's esophagus without dysplasia: Secondary | ICD-10-CM | POA: Insufficient documentation

## 2017-12-20 DIAGNOSIS — M1990 Unspecified osteoarthritis, unspecified site: Secondary | ICD-10-CM | POA: Diagnosis not present

## 2017-12-20 DIAGNOSIS — R0602 Shortness of breath: Secondary | ICD-10-CM | POA: Insufficient documentation

## 2017-12-20 DIAGNOSIS — I25119 Atherosclerotic heart disease of native coronary artery with unspecified angina pectoris: Secondary | ICD-10-CM | POA: Insufficient documentation

## 2017-12-20 DIAGNOSIS — Z79899 Other long term (current) drug therapy: Secondary | ICD-10-CM | POA: Insufficient documentation

## 2017-12-20 DIAGNOSIS — I200 Unstable angina: Secondary | ICD-10-CM | POA: Diagnosis present

## 2017-12-20 DIAGNOSIS — J449 Chronic obstructive pulmonary disease, unspecified: Secondary | ICD-10-CM | POA: Diagnosis not present

## 2017-12-20 DIAGNOSIS — Z7982 Long term (current) use of aspirin: Secondary | ICD-10-CM | POA: Diagnosis not present

## 2017-12-20 DIAGNOSIS — Z955 Presence of coronary angioplasty implant and graft: Secondary | ICD-10-CM

## 2017-12-20 DIAGNOSIS — I25110 Atherosclerotic heart disease of native coronary artery with unstable angina pectoris: Secondary | ICD-10-CM

## 2017-12-20 DIAGNOSIS — K219 Gastro-esophageal reflux disease without esophagitis: Secondary | ICD-10-CM | POA: Insufficient documentation

## 2017-12-20 DIAGNOSIS — E785 Hyperlipidemia, unspecified: Secondary | ICD-10-CM | POA: Insufficient documentation

## 2017-12-20 DIAGNOSIS — J45909 Unspecified asthma, uncomplicated: Secondary | ICD-10-CM | POA: Diagnosis not present

## 2017-12-20 DIAGNOSIS — Z7902 Long term (current) use of antithrombotics/antiplatelets: Secondary | ICD-10-CM | POA: Diagnosis not present

## 2017-12-20 DIAGNOSIS — I10 Essential (primary) hypertension: Secondary | ICD-10-CM | POA: Insufficient documentation

## 2017-12-20 DIAGNOSIS — Z881 Allergy status to other antibiotic agents status: Secondary | ICD-10-CM | POA: Diagnosis not present

## 2017-12-20 HISTORY — PX: CORONARY STENT INTERVENTION: CATH118234

## 2017-12-20 HISTORY — DX: Bronchiectasis, uncomplicated: J47.9

## 2017-12-20 HISTORY — PX: LEFT HEART CATH AND CORONARY ANGIOGRAPHY: CATH118249

## 2017-12-20 LAB — CBC
HCT: 45.3 % (ref 39.0–52.0)
Hemoglobin: 14.5 g/dL (ref 13.0–17.0)
MCH: 28.3 pg (ref 26.0–34.0)
MCHC: 32 g/dL (ref 30.0–36.0)
MCV: 88.3 fL (ref 78.0–100.0)
PLATELETS: 170 10*3/uL (ref 150–400)
RBC: 5.13 MIL/uL (ref 4.22–5.81)
RDW: 14.2 % (ref 11.5–15.5)
WBC: 8.2 10*3/uL (ref 4.0–10.5)

## 2017-12-20 LAB — TROPONIN I

## 2017-12-20 LAB — POCT ACTIVATED CLOTTING TIME
Activated Clotting Time: 257 seconds
Activated Clotting Time: 279 seconds

## 2017-12-20 SURGERY — LEFT HEART CATH AND CORONARY ANGIOGRAPHY
Anesthesia: LOCAL

## 2017-12-20 MED ORDER — NITROGLYCERIN 1 MG/10 ML FOR IR/CATH LAB
INTRA_ARTERIAL | Status: DC | PRN
  Administered 2017-12-20 (×3): 200 ug via INTRACORONARY

## 2017-12-20 MED ORDER — HYDROCHLOROTHIAZIDE 12.5 MG PO CAPS: 12.5000 mg | ORAL_CAPSULE | Freq: Every day | ORAL | Status: DC

## 2017-12-20 MED ORDER — SODIUM CHLORIDE 0.9 % IV SOLN: 250.0000 mL | INTRAVENOUS | Status: DC | PRN

## 2017-12-20 MED ORDER — ADENOSINE 12 MG/4ML IV SOLN
INTRAVENOUS | Status: AC
  Filled 2017-12-20: qty 12

## 2017-12-20 MED ORDER — MIDAZOLAM HCL 2 MG/2ML IJ SOLN
INTRAMUSCULAR | Status: AC
  Filled 2017-12-20: qty 2

## 2017-12-20 MED ORDER — ANGIOPLASTY BOOK
Freq: Once | Status: AC
  Administered 2017-12-20: 23:00:00
  Filled 2017-12-20: qty 1

## 2017-12-20 MED ORDER — LOSARTAN POTASSIUM-HCTZ 100-12.5 MG PO TABS: 1.0000 | ORAL_TABLET | Freq: Every day | ORAL | Status: DC

## 2017-12-20 MED ORDER — IOPAMIDOL (ISOVUE-370) INJECTION 76%
INTRAVENOUS | Status: AC
  Filled 2017-12-20: qty 100

## 2017-12-20 MED ORDER — ACETAMINOPHEN 325 MG PO TABS: 650.0000 mg | ORAL_TABLET | ORAL | Status: DC | PRN

## 2017-12-20 MED ORDER — OXYCODONE HCL 5 MG PO TABS
ORAL_TABLET | ORAL | Status: AC
  Filled 2017-12-20: qty 2

## 2017-12-20 MED ORDER — HYDRALAZINE HCL 20 MG/ML IJ SOLN: 10.0000 mg | Freq: Four times a day (QID) | INTRAMUSCULAR | Status: DC | PRN

## 2017-12-20 MED ORDER — ONDANSETRON HCL 4 MG/2ML IJ SOLN: 4.0000 mg | Freq: Four times a day (QID) | INTRAMUSCULAR | Status: DC | PRN

## 2017-12-20 MED ORDER — HYDROCHLOROTHIAZIDE 12.5 MG PO CAPS
12.5000 mg | ORAL_CAPSULE | Freq: Once | ORAL | Status: AC
  Administered 2017-12-20: 12.5 mg via ORAL
  Filled 2017-12-20: qty 1

## 2017-12-20 MED ORDER — SODIUM CHLORIDE 0.9% FLUSH: 3.0000 mL | INTRAVENOUS | Status: DC | PRN

## 2017-12-20 MED ORDER — VERAPAMIL HCL 2.5 MG/ML IV SOLN
INTRAVENOUS | Status: AC
  Filled 2017-12-20: qty 2

## 2017-12-20 MED ORDER — VITAMIN B-12 1000 MCG PO TABS
2000.0000 ug | ORAL_TABLET | Freq: Every day | ORAL | Status: DC
  Administered 2017-12-20: 2000 ug via ORAL
  Filled 2017-12-20: qty 2

## 2017-12-20 MED ORDER — NITROGLYCERIN 0.4 MG SL SUBL: 0.4000 mg | SUBLINGUAL_TABLET | SUBLINGUAL | Status: DC | PRN

## 2017-12-20 MED ORDER — ADENOSINE 12 MG/4ML IV SOLN
INTRAVENOUS | Status: AC
  Filled 2017-12-20: qty 4

## 2017-12-20 MED ORDER — PANTOPRAZOLE SODIUM 40 MG PO TBEC: 40.0000 mg | DELAYED_RELEASE_TABLET | Freq: Every day | ORAL | Status: DC

## 2017-12-20 MED ORDER — FENTANYL CITRATE (PF) 100 MCG/2ML IJ SOLN
INTRAMUSCULAR | Status: DC | PRN
  Administered 2017-12-20 (×3): 25 ug via INTRAVENOUS

## 2017-12-20 MED ORDER — SODIUM CHLORIDE 0.9% FLUSH
3.0000 mL | Freq: Two times a day (BID) | INTRAVENOUS | Status: DC
  Administered 2017-12-20: 3 mL via INTRAVENOUS

## 2017-12-20 MED ORDER — TIOTROPIUM BROMIDE MONOHYDRATE 18 MCG IN CAPS
18.0000 ug | ORAL_CAPSULE | Freq: Every day | RESPIRATORY_TRACT | Status: DC
  Filled 2017-12-20: qty 5

## 2017-12-20 MED ORDER — IOPAMIDOL (ISOVUE-300) INJECTION 61%
INTRAVENOUS | Status: AC
  Filled 2017-12-20: qty 50

## 2017-12-20 MED ORDER — OXYCODONE HCL 5 MG PO TABS
10.0000 mg | ORAL_TABLET | Freq: Four times a day (QID) | ORAL | Status: DC | PRN
  Administered 2017-12-20: 10 mg via ORAL

## 2017-12-20 MED ORDER — ASPIRIN EC 81 MG PO TBEC: 81.0000 mg | DELAYED_RELEASE_TABLET | Freq: Every evening | ORAL | Status: DC

## 2017-12-20 MED ORDER — SODIUM CHLORIDE 0.9 % WEIGHT BASED INFUSION
3.0000 mL/kg/h | INTRAVENOUS | Status: DC
  Administered 2017-12-20: 3 mL/kg/h via INTRAVENOUS

## 2017-12-20 MED ORDER — LIDOCAINE HCL (PF) 1 % IJ SOLN
INTRAMUSCULAR | Status: DC | PRN
  Administered 2017-12-20: 2 mL

## 2017-12-20 MED ORDER — CLOPIDOGREL BISULFATE 75 MG PO TABS: 75.0000 mg | ORAL_TABLET | Freq: Every evening | ORAL | Status: DC

## 2017-12-20 MED ORDER — LIDOCAINE HCL (PF) 1 % IJ SOLN
INTRAMUSCULAR | Status: AC
  Filled 2017-12-20: qty 30

## 2017-12-20 MED ORDER — LOSARTAN POTASSIUM 50 MG PO TABS: 100.0000 mg | ORAL_TABLET | Freq: Every day | ORAL | Status: DC

## 2017-12-20 MED ORDER — SODIUM CHLORIDE 0.9% FLUSH
3.0000 mL | Freq: Two times a day (BID) | INTRAVENOUS | Status: DC
  Administered 2017-12-20: 23:00:00 3 mL via INTRAVENOUS

## 2017-12-20 MED ORDER — SODIUM CHLORIDE 0.9 % WEIGHT BASED INFUSION
1.0000 mL/kg/h | INTRAVENOUS | Status: DC
  Administered 2017-12-20: 1 mL/kg/h via INTRAVENOUS

## 2017-12-20 MED ORDER — METOPROLOL SUCCINATE ER 100 MG PO TB24
100.0000 mg | ORAL_TABLET | Freq: Every day | ORAL | Status: DC
  Filled 2017-12-20: qty 1

## 2017-12-20 MED ORDER — ASPIRIN 81 MG PO CHEW: 81.0000 mg | CHEWABLE_TABLET | ORAL | Status: DC

## 2017-12-20 MED ORDER — HEPARIN (PORCINE) IN NACL 2-0.9 UNIT/ML-% IJ SOLN
INTRAMUSCULAR | Status: AC | PRN
  Administered 2017-12-20: 1000 mL via INTRA_ARTERIAL

## 2017-12-20 MED ORDER — TIZANIDINE HCL 4 MG PO TABS
4.0000 mg | ORAL_TABLET | Freq: Every day | ORAL | Status: DC
Start: 2017-12-20 — End: 2017-12-21
  Administered 2017-12-20: 4 mg via ORAL
  Filled 2017-12-20: qty 1

## 2017-12-20 MED ORDER — HEPARIN SODIUM (PORCINE) 1000 UNIT/ML IJ SOLN
INTRAMUSCULAR | Status: DC | PRN
  Administered 2017-12-20: 5000 [IU] via INTRAVENOUS
  Administered 2017-12-20: 4000 [IU] via INTRAVENOUS
  Administered 2017-12-20: 3000 [IU] via INTRAVENOUS

## 2017-12-20 MED ORDER — HEPARIN SODIUM (PORCINE) 1000 UNIT/ML IJ SOLN
INTRAMUSCULAR | Status: AC
  Filled 2017-12-20: qty 1

## 2017-12-20 MED ORDER — VERAPAMIL HCL 2.5 MG/ML IV SOLN
INTRAVENOUS | Status: DC | PRN
  Administered 2017-12-20: 10 mL via INTRA_ARTERIAL

## 2017-12-20 MED ORDER — ROSUVASTATIN CALCIUM 40 MG PO TABS
40.0000 mg | ORAL_TABLET | Freq: Every evening | ORAL | Status: DC
  Administered 2017-12-20: 40 mg via ORAL
  Filled 2017-12-20: qty 1

## 2017-12-20 MED ORDER — HEPARIN (PORCINE) IN NACL 2-0.9 UNIT/ML-% IJ SOLN
INTRAMUSCULAR | Status: AC
  Filled 2017-12-20: qty 1000

## 2017-12-20 MED ORDER — FLUTICASONE FUROATE-VILANTEROL 100-25 MCG/INH IN AEPB
1.0000 | INHALATION_SPRAY | Freq: Every day | RESPIRATORY_TRACT | Status: DC
  Filled 2017-12-20: qty 28

## 2017-12-20 MED ORDER — LOSARTAN POTASSIUM 50 MG PO TABS
100.0000 mg | ORAL_TABLET | Freq: Once | ORAL | Status: AC
  Administered 2017-12-20: 100 mg via ORAL
  Filled 2017-12-20: qty 2

## 2017-12-20 MED ORDER — FENTANYL CITRATE (PF) 100 MCG/2ML IJ SOLN
INTRAMUSCULAR | Status: AC
  Filled 2017-12-20: qty 2

## 2017-12-20 MED ORDER — MIDAZOLAM HCL 2 MG/2ML IJ SOLN
INTRAMUSCULAR | Status: DC | PRN
  Administered 2017-12-20 (×3): 1 mg via INTRAVENOUS

## 2017-12-20 MED ORDER — SODIUM CHLORIDE 0.9 % WEIGHT BASED INFUSION: 1.0000 mL/kg/h | INTRAVENOUS | Status: AC

## 2017-12-20 MED ORDER — ADENOSINE (DIAGNOSTIC) 140MCG/KG/MIN
INTRAVENOUS | Status: AC | PRN
  Administered 2017-12-20: 140 ug/kg/min via INTRAVENOUS

## 2017-12-20 MED ORDER — CLOPIDOGREL BISULFATE 75 MG PO TABS: 75.0000 mg | ORAL_TABLET | ORAL | Status: DC

## 2017-12-20 MED ORDER — IOPAMIDOL (ISOVUE-370) INJECTION 76%
INTRAVENOUS | Status: DC | PRN
  Administered 2017-12-20: 140 mL via INTRA_ARTERIAL

## 2017-12-20 MED ORDER — HEPARIN (PORCINE) IN NACL 2-0.9 UNIT/ML-% IJ SOLN
INTRAMUSCULAR | Status: AC
  Filled 2017-12-20: qty 500

## 2017-12-20 MED ORDER — SODIUM CHLORIDE 0.9% FLUSH: 3.0000 mL | Freq: Two times a day (BID) | INTRAVENOUS | Status: DC

## 2017-12-20 MED ORDER — NITROGLYCERIN 1 MG/10 ML FOR IR/CATH LAB
INTRA_ARTERIAL | Status: AC
  Filled 2017-12-20: qty 10

## 2017-12-20 SURGICAL SUPPLY — 22 items
BALLN SAPPHIRE 2.5X15 (BALLOONS) ×2
BALLN SAPPHIRE ~~LOC~~ 4.0X15 (BALLOONS) ×2 IMPLANT
BALLOON SAPPHIRE 2.5X15 (BALLOONS) ×1 IMPLANT
CATH IMPULSE 5F ANG/FL3.5 (CATHETERS) ×2 IMPLANT
CATH LAUNCHER 6FR AL.75 (CATHETERS) ×2 IMPLANT
CATH MICROCATH NAVVUS (MICROCATHETER) ×1 IMPLANT
CATH VISTA GUIDE 6FR 3DRC (CATHETERS) ×2 IMPLANT
DEVICE RAD COMP TR BAND LRG (VASCULAR PRODUCTS) ×2 IMPLANT
GLIDESHEATH SLEND SS 6F .021 (SHEATH) ×2 IMPLANT
GUIDELINER 6F (CATHETERS) ×2 IMPLANT
GUIDEWIRE INQWIRE 1.5J.035X260 (WIRE) ×2 IMPLANT
GUIDEWIRE PRESSURE COMET II (WIRE) ×2 IMPLANT
INQWIRE 1.5J .035X260CM (WIRE) ×4
KIT ENCORE 26 ADVANTAGE (KITS) ×2 IMPLANT
KIT HEART LEFT (KITS) ×2 IMPLANT
MICROCATHETER NAVVUS (MICROCATHETER) ×2
PACK CARDIAC CATHETERIZATION (CUSTOM PROCEDURE TRAY) ×2 IMPLANT
STENT SIERRA 4.00 X 23 MM (Permanent Stent) ×2 IMPLANT
SYR MEDRAD MARK V 150ML (SYRINGE) ×2 IMPLANT
TRANSDUCER W/STOPCOCK (MISCELLANEOUS) ×2 IMPLANT
TUBING CIL FLEX 10 FLL-RA (TUBING) ×2 IMPLANT
WIRE HI TORQ WHISPER MS 190CM (WIRE) ×2 IMPLANT

## 2017-12-20 NOTE — Progress Notes (Signed)
Pt suddenly states he feels odd, face tingling, just does not feel well,  bp elevated to 163/107 p 67, cuff was readjusted and bp was then 177/88 p 65. Denies chest pain. No change in radial site. Cecilie Kicks PA was called and updated, EKG was ordered and completed. Continue to monitor.

## 2017-12-20 NOTE — Discharge Summary (Signed)
Discharge Summary    Patient ID: Nicholas Love,  MRN: [id number], DOB/AGE: May 18, 1956 62 y.o.  Admit date: 12/20/2017 Discharge date: 12/21/2017  Primary Care Provider: Ocie Doyne Primary Cardiologist: Dr. Geraldo Pitter  Discharge Diagnoses    Principal Problem:   Unstable angina [facility]) Active Problems:   COPD (chronic obstructive pulmonary disease) (HCC)   Hyperlipemia   Hypertension   CAD (coronary artery disease)  Diagnostic Studies/Procedures    Conclusion     Dist RCA lesion is 75% stenosed.  Mid RCA lesion is 20% stenosed.  Prox RCA to Mid RCA lesion is 20% stenosed.  Ost 1st Diag lesion is 100% stenosed.  Prox LAD-1 lesion is 30% stenosed.  Previously placed Prox LAD-2 stent (unknown type) is widely patent.  Prox LAD to Mid LAD lesion is 30% stenosed.  The left ventricular systolic function is normal.  LV end diastolic pressure is normal.  The left ventricular ejection fraction is 55-65% by visual estimate.  Post intervention, there is a 0% residual stenosis.  A drug-eluting stent was successfully placed using a STENT SIERRA 4.00 X 23 MM.   1. 2 vessel obstructive CAD.    - 100% first diagonal with left to left collaterals.    - 75% distal RCA with abnormal FFR of 0.65 2. Continued patency of stents in the mid RCA x 2 and mid LAD 3. Normal LV function 4. Normal LVEDP 5. Successful stenting of the distal RCA with DES.   Plan: continue DAPT for at least one year. Anticipate same day discharge if stable.      _____________     History of Present Illness     Nicholas Love is a 62 y.o. male with history of CAD (prior mild RCAx2 and LAD stenting), bronchiectasis, arthritis, asthma, COPD, GERD, hiatal hernia, HTN, hyperlipidemia, sleep apnea who presented to [facility] for planned cath. Dr. Ervin Knack note indicates prior history of coronary stenting in 2017. We do not have access to that prior report but cath note today indicated previously placed stents  in mid RCA x 2 and mid LAD. Most recently he presented back to the office with symptoms concerning for recurrent angina, thus cath was recommended.   Hospital Course    He presented for this procedure today which demonstrated 75% distal RCA stenosis with abnormal FFR of 0.65 which was treated with a drug-eluting stent. He also had 100% ostial D1 with left to left collaterals, patent prior stents in the mid RCA x 2 and mid LAD, and nonobstructive disease in mid RCA (20%), prox LAD (30%), prox-mid LAD (30%). This also showed normal LV function, normal LVEDP. It was recommended he continue DAPT for at least one year. He was on ASA and Plavix prior to admission so no changes were made. Dr. Martinique felt he would be eligible for same-day PCI if stable. Troponins were trended for potential participation in Optimize trial (not for any particular complication or post-cath symptom). He was seen by cardiac rehab and felt to do well. I will be signing out to my colleague to reassess patient at 1640. Of note, he was asked to discontinue St. John's wort due to drug interactions (particularly with statin) and avoid phenylephrine nasal spray due to his CAD. Close follow-up has been arranged with Dr. Lennox Pippins.   Same day discharge on 1/11 was cancelled secondary to oozing from the right radial cath site. Patient's blood pressure was also noted to be elevated prior to potential discharge. It was recommended he be  admitted to 6 C for overnight observation. Overnight, he did not have any acute issues. Right radial cath site did not have any further issues and oozing stopped. BP has significantly improved. NO further chest pain. He has ambulated and is ready to go home.   His right radial cath site has been assessed and no further bleeding is noted. He has 2+ radial pulse. No bruising noted. He has been seen by Dr. Bronson Ing and felt to be stable for discharge. He was already on DAPT with ASA and Plavix prior to admission, these  will be continued upon discharge.  _____________  Discharge Vitals Blood pressure (!) 147/77, pulse 74, temperature 98.3 F (36.8 C), temperature source Oral, resp. rate 13, height 6' (1.829 m), weight 216 lb 7.9 oz (98.2 kg), SpO2 96 %.  Filed Weights   12/20/17 0809 12/21/17 0438  Weight: 221 lb (100.2 kg) 216 lb 7.9 oz (98.2 kg)    Labs & Radiologic Studies    CBC Recent Labs    12/20/17 0831 12/21/17 0342  WBC 8.2 9.7  HGB 14.5 14.2  HCT 45.3 43.1  MCV 88.3 88.3  PLT 170 166   Cardiac Enzymes Recent Labs    12/20/17 1044 12/20/17 1606  TROPONINI <0.03 <0.03  _____________  Dg Chest 2 View  Result Date: 12/17/2017 CLINICAL DATA:  Several month history of shortness of breath. Preoperative study prior to cardiac catheterization. History of asthma-COPD, smoker, coronary artery disease with previous MI. EXAM: CHEST  2 VIEW COMPARISON:  Report of a chest x-ray dated October 27, 2001. FINDINGS: The lungs are adequately inflated. There is no focal infiltrate. There is no pleural effusion. The heart and pulmonary vascularity are normal. There calcification in the wall of the aortic arch. The mediastinum is normal in width. There is no pleural effusion. There is multilevel degenerative disc disease of the thoracic spine. IMPRESSION: There is no active cardiopulmonary disease. Thoracic aortic atherosclerosis. Electronically Signed   By: David  Martinique M.D.   On: 12/17/2017 15:39   Disposition   Pt is being discharged home today in good condition.  Follow-up Plans & Appointments    Follow-up Information    Revankar, Reita Cliche, MD Follow up.   Specialty:  Cardiology Why:  [place] office - 12/30/17 at 4pm. Arrive 15 minutes prior to appointment to check in. Contact information: [place] [address] [phone]          Discharge Instructions    AMB Referral to Phase II Cardiac Rehab   Complete by:  As directed     Diagnosis:  Coronary Stents   Amb Referral to Cardiac Rehabilitation   Complete by:  As directed    Referring to [facility] Phase 2 CRP   Diagnosis:  Coronary Stents   Call MD for:  difficulty breathing, headache or visual disturbances   Complete by:  As directed    Call MD for:  extreme fatigue   Complete by:  As directed    Call MD for:  hives   Complete by:  As directed    Call MD for:  persistant dizziness or light-headedness   Complete by:  As directed    Call MD for:  persistant nausea and vomiting   Complete by:  As directed    Call MD for:  redness, tenderness, or signs of infection (pain, swelling, redness, odor or green/yellow discharge around incision site)   Complete by:  As directed  Call MD for:  severe uncontrolled pain   Complete by:  As directed    Call MD for:  temperature >100.4   Complete by:  As directed    Diet - low sodium heart healthy   Complete by:  As directed    Diet - low sodium heart healthy   Complete by:  As directed    Discharge instructions   Complete by:  As directed    No driving for 2 days. No lifting over 5 lbs for 1 week. No sexual activity for 1 week. Keep procedure site clean & dry. If you notice increased pain, swelling, bleeding or pus, call/return!  You may shower, but no soaking baths/hot tubs/pools for 1 week.  St. John's wort can interact with multiple medications including your statin. Would recommend you hold this for now and discuss with your primary care doctor and cardiologist.  Nasal phenylephrine is not advised for patients with heart disease, so this was stopped from your med list.   Increase activity slowly   Complete by:  As directed    Increase activity slowly   Complete by:  As directed       Discharge Medications   Allergies as of 12/21/2017      Reactions   Ceftriaxone Anaphylaxis   Cefdinir Rash   Ciprofloxacin Rash          Medication List    STOP taking these medications   SINEX REGULAR NA   St  Johns Wort 300 MG Tabs     TAKE these medications   albuterol (2.5 MG/3ML) 0.083% nebulizer solution Commonly known as:  PROVENTIL Take 3 mLs by nebulization every 4 (four) hours as needed for shortness of breath   aspirin EC 81 MG tablet Take 81 mg by mouth every evening.   BREO ELLIPTA 100-25 MCG/INH Aepb Generic drug:  fluticasone furoate-vilanterol Inhale 1 puff into the lungs daily.   clopidogrel 75 MG tablet Commonly known as:  PLAVIX Take 75 mg by mouth every evening.   diphenhydrAMINE 25 MG tablet Commonly known as:  BENADRYL Take 50 mg by mouth at bedtime as needed for allergies.   Glucosamine Chondroitin Triple Tabs Take 1 tablet by mouth at bedtime.   losartan-hydrochlorothiazide 100-12.5 MG tablet Commonly known as:  HYZAAR Take 1 tablet by mouth daily.   metoprolol succinate 100 MG 24 hr tablet Commonly known as:  TOPROL-XL Take 100 mg by mouth daily.   nitroGLYCERIN 0.4 MG SL tablet Commonly known as:  NITROSTAT 1 TABLET UNDER THE TONGUE AS NEEDED FOR CHEST PAIN UP TO 3 TABS, IF NO RESULT CALL 911   Oxycodone HCl 10 MG Tabs Take 10 mg by mouth every 6 (six) hours as needed (pain).   pantoprazole 40 MG tablet Commonly known as:  PROTONIX Take 40 mg by mouth daily.   rosuvastatin 40 MG tablet Commonly known as:  CRESTOR Take 40 mg by mouth every evening.   SPIRIVA RESPIMAT 2.5 MCG/ACT Aers Generic drug:  Tiotropium Bromide Monohydrate Inhale 2 puffs into the lungs daily.   tiZANidine 4 MG tablet Commonly known as:  ZANAFLEX Take 4 mg by mouth at bedtime.   vitamin B-12 1000 MCG tablet Commonly known as:  CYANOCOBALAMIN Take 2,000 mcg by mouth daily.        Allergies:  Allergies  Allergen Reactions  . Ceftriaxone Anaphylaxis  . Cefdinir Rash  . Ciprofloxacin Rash     Outstanding Labs/Studies   N/A  Duration of Discharge Encounter  Greater than 30 minutes including physician time.  Signed, Christell Faith PA-C 12/21/2017, 8:11  AM

## 2017-12-20 NOTE — Research (Signed)
OPTIMZIE Informed Consent   Subject Name: Nicholas Love  Subject met inclusion and exclusion criteria.  The informed consent form, study requirements and expectations were reviewed with the subject and questions and concerns were addressed prior to the signing of the consent form.  The subject verbalized understanding of the trail requirements.  The subject agreed to participate in the OPTIMIZE trial and signed the informed consent.  The informed consent was obtained prior to performance of any protocol-specific procedures for the subject.  A copy of the signed informed consent was given to the subject and a copy was placed in the subject's medical record. Only applicable if randomized.  [name] 12/20/2017, [id number]

## 2017-12-20 NOTE — Progress Notes (Signed)
Pt is feeling well, better than before admit but his Rt radial sit continues to ooze.  Discussed with Dr. Martinique and best to keep overnight obs on 6 C --plan d/c in AM.  Pt agreeable.

## 2017-12-20 NOTE — Progress Notes (Signed)
Patient received from short stay at 2050. Ambulating to bathroom with steady gait. Telemetry SR 90, right radial site dressing with old drainage noted, level 0 at this time with no active bleeding, no bruising or hematoma noted. Patient denies chest pain or shortness of breath. Report given to Rennis Harding RN.

## 2017-12-20 NOTE — Interval H&P Note (Signed)
History and Physical Interval Note:  12/20/2017 9:08 AM  Nicholas Love  has presented today for surgery, with the diagnosis of angina  The various methods of treatment have been discussed with the patient and family. After consideration of risks, benefits and other options for treatment, the patient has consented to  Procedure(s): LEFT HEART CATH AND CORONARY ANGIOGRAPHY (N/A) as a surgical intervention .  The patient's history has been reviewed, patient examined, no change in status, stable for surgery.  I have reviewed the patient's chart and labs.  Questions were answered to the patient's satisfaction.   Cath Lab Visit (complete for each Cath Lab visit)  Clinical Evaluation Leading to the Procedure:   ACS: Yes.    Non-ACS:    Anginal Classification: CCS III  Anti-ischemic medical therapy: Maximal Therapy (2 or more classes of medications)  Non-Invasive Test Results: No non-invasive testing performed  Prior CABG: No previous CABG       Nicholas Love [place] 12/20/2017 9:08 AM

## 2017-12-20 NOTE — Progress Notes (Signed)
[id number] Education completed with pt who voiced understanding. Stressed importance of plavix with stent. Reviewed NTG use, ex ed, heart healthy food choices and CRP 2. Will refer to [place] Phase 2.  Graylon Good RN BSN 12/20/2017 1:50 PM

## 2017-12-21 ENCOUNTER — Encounter (HOSPITAL_COMMUNITY): Payer: Self-pay | Admitting: Cardiology

## 2017-12-21 ENCOUNTER — Other Ambulatory Visit: Payer: Self-pay

## 2017-12-21 DIAGNOSIS — I25118 Atherosclerotic heart disease of native coronary artery with other forms of angina pectoris: Secondary | ICD-10-CM | POA: Diagnosis not present

## 2017-12-21 DIAGNOSIS — G4733 Obstructive sleep apnea (adult) (pediatric): Secondary | ICD-10-CM

## 2017-12-21 DIAGNOSIS — Z9989 Dependence on other enabling machines and devices: Secondary | ICD-10-CM | POA: Diagnosis not present

## 2017-12-21 DIAGNOSIS — Z955 Presence of coronary angioplasty implant and graft: Secondary | ICD-10-CM | POA: Diagnosis not present

## 2017-12-21 DIAGNOSIS — E782 Mixed hyperlipidemia: Secondary | ICD-10-CM | POA: Diagnosis not present

## 2017-12-21 DIAGNOSIS — I200 Unstable angina: Secondary | ICD-10-CM | POA: Diagnosis not present

## 2017-12-21 DIAGNOSIS — I2510 Atherosclerotic heart disease of native coronary artery without angina pectoris: Secondary | ICD-10-CM | POA: Diagnosis not present

## 2017-12-21 DIAGNOSIS — I10 Essential (primary) hypertension: Secondary | ICD-10-CM

## 2017-12-21 DIAGNOSIS — J449 Chronic obstructive pulmonary disease, unspecified: Secondary | ICD-10-CM

## 2017-12-21 LAB — CBC
HEMATOCRIT: 43.1 % (ref 39.0–52.0)
Hemoglobin: 14.2 g/dL (ref 13.0–17.0)
MCH: 29.1 pg (ref 26.0–34.0)
MCHC: 32.9 g/dL (ref 30.0–36.0)
MCV: 88.3 fL (ref 78.0–100.0)
Platelets: 166 10*3/uL (ref 150–400)
RBC: 4.88 MIL/uL (ref 4.22–5.81)
RDW: 14.3 % (ref 11.5–15.5)
WBC: 9.7 10*3/uL (ref 4.0–10.5)

## 2017-12-21 LAB — BASIC METABOLIC PANEL
Anion gap: 9 (ref 5–15)
BUN: 16 mg/dL (ref 6–20)
CALCIUM: 8.8 mg/dL — AB (ref 8.9–10.3)
CO2: 23 mmol/L (ref 22–32)
Chloride: 105 mmol/L (ref 101–111)
Creatinine, Ser: 0.88 mg/dL (ref 0.61–1.24)
GFR calc Af Amer: >60 mL/min (ref >60)
GFR calc non Af Amer: >60 mL/min (ref >60)
GLUCOSE: 98 mg/dL (ref 65–99)
POTASSIUM: 4.3 mmol/L (ref 3.5–5.1)
Sodium: 137 mmol/L (ref 135–145)

## 2017-12-21 MED ORDER — ALBUTEROL SULFATE (2.5 MG/3ML) 0.083% IN NEBU
2.5000 mg | INHALATION_SOLUTION | Freq: Four times a day (QID) | RESPIRATORY_TRACT | Status: DC | PRN
  Administered 2017-12-21: 2.5 mg via RESPIRATORY_TRACT
  Filled 2017-12-21: qty 3

## 2017-12-21 MED ORDER — PNEUMOCOCCAL VAC POLYVALENT 25 MCG/0.5ML IJ INJ
0.5000 mL | INJECTION | INTRAMUSCULAR | Status: DC | PRN
  Filled 2017-12-21: qty 0.5

## 2017-12-21 NOTE — Progress Notes (Signed)
Progress Note  Patient Name: Nicholas Love Date of Encounter: 12/21/2017  Primary Cardiologist: Revankar  Subjective   Discharge was cancelled on 12/20/2017 secondary to slow oozing of right radial cath site. Admitted to 6 C without event. No further oozing from radial cath site. HGB stable on CBC this morning. Ambulated without issues. BP much improved and stable. Requests albuterol nebulizer this morning (typically has one breathing treatment each morning). Ready to go home.   Inpatient Medications    Scheduled Meds: . aspirin EC  81 mg Oral QPM  . clopidogrel  75 mg Oral QPM  . fluticasone furoate-vilanterol  1 puff Inhalation Daily  . hydrochlorothiazide  12.5 mg Oral Daily  . losartan  100 mg Oral Daily  . metoprolol succinate  100 mg Oral Daily  . pantoprazole  40 mg Oral Daily  . rosuvastatin  40 mg Oral QPM  . sodium chloride flush  3 mL Intravenous Q12H  . sodium chloride flush  3 mL Intravenous Q12H  . tiotropium  18 mcg Inhalation Daily  . tiZANidine  4 mg Oral QHS  . vitamin B-12  2,000 mcg Oral Daily   Continuous Infusions: . sodium chloride    . sodium chloride     PRN Meds: sodium chloride, sodium chloride, acetaminophen, albuterol, hydrALAZINE, nitroGLYCERIN, ondansetron (ZOFRAN) IV, oxyCODONE, sodium chloride flush, sodium chloride flush   Vital Signs    Vitals:   12/20/17 1900 12/20/17 2000 12/21/17 0438 12/21/17 0727  BP: (!) 157/77 133/72 (!) 148/91 (!) 147/77  Pulse: 72 80 62 74  Resp: 14 17 10 13   Temp:   97.8 F (36.6 C) 98.3 F (36.8 C)  TempSrc:   Oral Oral  SpO2: 94% 95%  96%  Weight:   216 lb 7.9 oz (98.2 kg)   Height:        Intake/Output Summary (Last 24 hours) at 12/21/2017 0755 Last data filed at 12/21/2017 0748 Gross per 24 hour  Intake 600 ml  Output 500 ml  Net 100 ml   Filed Weights   12/20/17 0809 12/21/17 0438  Weight: 221 lb (100.2 kg) 216 lb 7.9 oz (98.2 kg)    Telemetry    NSR - Personally Reviewed  ECG      n/a - Personally Reviewed  Physical Exam   GEN: No acute distress.   Neck: No JVD. Cardiac: RRR, no murmurs, rubs, or gallops. Right radial cath site without bleeding, bruising, swelling, erythema, warmth, or TTP. Radial pulse 2+. Respiratory: Faint expiratory wheezing bilaterally.  GI: Soft, nontender, non-distended.   MS: No edema; No deformity. Neuro:  Alert and oriented x 3; Nonfocal; 5/5 strength throughout.  Psych: Normal affect.  Labs    Chemistry Recent Labs  Lab 12/17/17 1128 12/21/17 0342  NA 143 137  K 4.9 4.3  CL 101 105  CO2 26 23  GLUCOSE 106* 98  BUN 24 16  CREATININE 1.06 0.88  CALCIUM 9.8 8.8*  GFRNONAA 75 >60  GFRAA 87 >60  ANIONGAP  --  9     Hematology Recent Labs  Lab 12/20/17 0831 12/21/17 0342  WBC 8.2 9.7  RBC 5.13 4.88  HGB 14.5 14.2  HCT 45.3 43.1  MCV 88.3 88.3  MCH 28.3 29.1  MCHC 32.0 32.9  RDW 14.2 14.3  PLT 170 166    Cardiac Enzymes Recent Labs  Lab 12/20/17 1044 12/20/17 1606  TROPONINI <0.03 <0.03   No results for input(s): TROPIPOC in the last 168 hours.  BNPNo results for input(s): BNP, PROBNP in the last 168 hours.   DDimer No results for input(s): DDIMER in the last 168 hours.   Radiology    No results found.  Cardiac Studies   LHC 12/20/2017: Coronary Findings   Diagnostic  Dominance: Right  Left Main  Vessel was injected. Vessel is normal in caliber. Vessel is angiographically normal.  Left Anterior Descending  Prox LAD-1 lesion 30% stenosed  Prox LAD-1 lesion is 30% stenosed. The lesion is moderately calcified.  Prox LAD-2 lesion 0% stenosed  Previously placed Prox LAD-2 stent (unknown type) is widely patent.  Prox LAD to Mid LAD-4 lesion 30% stenosed  Prox LAD to Mid LAD lesion is 30% stenosed. The lesion is moderately calcified.  First Diagonal Branch  Collaterals  1st Diag filled by collaterals from Mid LAD.    Ost 1st Diag lesion 100% stenosed  Ost 1st Diag lesion is 100% stenosed.   Left Circumflex  Vessel was injected. Vessel is normal in caliber. The vessel exhibits minimal luminal irregularities.  Right Coronary Artery  Prox RCA to Mid RCA lesion 20% stenosed  Prox RCA to Mid RCA lesion is 20% stenosed. The lesion was previously treated using a stent (unknown type) over 2 years ago.  Mid RCA lesion 20% stenosed  Mid RCA lesion is 20% stenosed. The lesion was previously treated using a stent (unknown type) over 2 years ago.  Dist RCA lesion 75% stenosed  Dist RCA lesion is 75% stenosed. The lesion is type A, located at the bend and focal. Pressure wire/FFR was performed on the lesion. FFR: 0.65.  Intervention   Dist RCA lesion  Stent  Lesion crossed with guidewire using a WIRE HI TORQ WHISPER MS 190CM. Pre-stent angioplasty was performed using a BALLOON SAPPHIRE 2.5X15. A drug-eluting stent was successfully placed using a STENT SIERRA 4.00 X 23 MM. Maximum pressure: 14 atm. Stent strut is well apposed. Stent does not overlap previously placed stentPost-stent angioplasty was not performed.  Post-Intervention Lesion Assessment  The intervention was successful. Pre-interventional TIMI flow is 3. Post-intervention TIMI flow is 3. No complications occurred at this lesion.  There is no residual stenosis post intervention.  Wall Motion              Left Heart   Left Ventricle The left ventricular size is normal. The left ventricular systolic function is normal. LV end diastolic pressure is normal. The left ventricular ejection fraction is 55-65% by visual estimate. No regional wall motion abnormalities.  Coronary Diagrams   Diagnostic Diagram       Post-Intervention Diagram        Conclusion     Dist RCA lesion is 75% stenosed.  Mid RCA lesion is 20% stenosed.  Prox RCA to Mid RCA lesion is 20% stenosed.  Ost 1st Diag lesion is 100% stenosed.  Prox LAD-1 lesion is 30% stenosed.  Previously placed Prox LAD-2 stent (unknown type) is widely  patent.  Prox LAD to Mid LAD lesion is 30% stenosed.  The left ventricular systolic function is normal.  LV end diastolic pressure is normal.  The left ventricular ejection fraction is 55-65% by visual estimate.  Post intervention, there is a 0% residual stenosis.  A drug-eluting stent was successfully placed using a STENT SIERRA 4.00 X 23 MM.   1. 2 vessel obstructive CAD.    - 100% first diagonal with left to left collaterals.    - 75% distal RCA with abnormal FFR of 0.65 2. Continued patency  of stents in the mid RCA x 2 and mid LAD 3. Normal LV function 4. Normal LVEDP 5. Successful stenting of the distal RCA with DES.   Plan: continue DAPT for at least one year. Anticipate same day discharge if stable.      Patient Profile     62 y.o. male with history of CAD (prior mild RCAx2 and LAD stenting), bronchiectasis, arthritis, asthma, COPD, GERD, hiatal hernia, HTN, hyperlipidemia, sleep apnea who presented to [facility] for planned cath. Dr. Ervin Knack note indicates prior history of coronary stenting in 2017. We do not have access to that prior report but cath note today indicated previously placed stents in mid RCA x 2 and mid LAD. Most recently he presented back to the office with symptoms concerning for recurrent angina, thus cath was recommended.  Assessment & Plan    1. CAD with unstable angina as above: -Underwent outpatient diagnostic LHC 1/11 with PCI as detailed above. Same-day discharge was cancelled 2/2 oozing from the right radial cath site. Admitted to 6 C overnight without any acute issues. Oozing from cath site has resolved. HGB stable this morning -No chest pain -Ambulated without issues -DAPT with ASA and Plavix (already on these coming in) -Aggressive secondary prevention  -Crestor, Toprol, losartan  2. Asthma/COPD: -Requests breathing treatment this AM -Continue PTA medications -Stable -Follow up with PCP  3. HTN: -Much improved -Continue current  medications  4. HLD: -Crestor  5. OSA: -CPAP  For questions or updates, please contact [place] Please consult [email or web address] for contact info under Cardiology/STEMI.    Signed, Christell Faith, PA-C [facility] Pager: [phone] 12/21/2017, 7:55 AM   The patient was seen and examined, and I agree with the history, physical exam, assessment and plan as documented above, with modifications as noted below.  Discharge cancelled yesterday due to oozing from right radial cath site. I inspected today, had some slow mild oozing after I remove bandage. Denies chest pain. Hgb stable (14.2). Has chronic exertional dyspnea worse in the last few months. He follows with pulmonary.  Otherwise stable after DES to the distal RCA. Mildly hypertensive this morning. This will need continued monitoring. Will discharge today.  Kate Sable, MD, [facility]  12/21/2017 8:27 AM

## 2017-12-25 ENCOUNTER — Ambulatory Visit: Payer: PPO | Admitting: Cardiology

## 2017-12-25 ENCOUNTER — Encounter: Payer: Self-pay | Admitting: Cardiology

## 2017-12-25 VITALS — BP 162/74 | HR 79 | Ht 72.0 in | Wt 220.0 lb

## 2017-12-25 DIAGNOSIS — J431 Panlobular emphysema: Secondary | ICD-10-CM | POA: Diagnosis not present

## 2017-12-25 DIAGNOSIS — I2510 Atherosclerotic heart disease of native coronary artery without angina pectoris: Secondary | ICD-10-CM | POA: Diagnosis not present

## 2017-12-25 DIAGNOSIS — I10 Essential (primary) hypertension: Secondary | ICD-10-CM | POA: Diagnosis not present

## 2017-12-25 DIAGNOSIS — E782 Mixed hyperlipidemia: Secondary | ICD-10-CM | POA: Diagnosis not present

## 2017-12-25 MED ORDER — RANOLAZINE ER 500 MG PO TB12: 500.0000 mg | ORAL_TABLET | Freq: Two times a day (BID) | ORAL | 0 refills | Status: DC

## 2017-12-25 MED ORDER — NEBIVOLOL HCL 5 MG PO TABS: 5.0000 mg | ORAL_TABLET | Freq: Two times a day (BID) | ORAL | 1 refills | Status: DC

## 2017-12-25 NOTE — Progress Notes (Signed)
Cardiology Office Note:    Date:  12/25/2017   ID:  Nicholas Love, DOB 1956/04/15, MRN [id number]  PCP:  Ocie Doyne., MD  Cardiologist:  Jenean Lindau, MD   Referring MD: Ocie Doyne., MD    ASSESSMENT:    1. Coronary artery disease involving native coronary artery of native heart without angina pectoris   2. Essential hypertension   3. Panlobular emphysema ([place])   4. Mixed hyperlipidemia    PLAN:    In order of problems listed above:  1. Secondary prevention stressed with the patient.  Importance of compliance with diet and medications stressed and he vocalized understanding.  Coronary angiography report was discussed with the patient at extensive length and his wife had multiple questions which were answered to their satisfaction. 2. Patient wants me to stop metoprolol and therefore I have replaced it with by bystolic 5 mg twice a day.  He will get his follow-up blood pressures in cardiac rehab program where he wants to enroll and will be visiting  them in the next few days. 3. Patient's blood pressure stable.  He will monitor it regularly at home and he tells me it was much better before he came to the office.  It would also be monitored at the cardiac rehab facility. 4. I have also initiated him on Ranexa 500 mg twice a day for 2 weeks and then he will start 1 g twice daily. 5. He will have fasting blood work when he comes to see me at the end of the first week of February.  He knows to go to the nearest emergency room for any significant concerns.  Sublingual nitroglycerin prescription was sent, its protocol and 911 protocol explained and the patient vocalized understanding questions were answered to the patient's satisfaction   Medication Adjustments/Labs and Tests Ordered: Current medicines are reviewed at length with the patient today.  Concerns regarding medicines are outlined above.  No orders of the defined types were placed in this encounter.  Meds ordered this  encounter  Medications  . nebivolol (BYSTOLIC) 5 MG tablet    Sig: Take 1 tablet (5 mg total) by mouth 2 (two) times daily.    Dispense:  180 tablet    Refill:  1  . ranolazine (RANEXA) 500 MG 12 hr tablet    Sig: Take 1 tablet (500 mg total) by mouth 2 (two) times daily.    Dispense:  30 tablet    Refill:  0    Will increase the medication to 1,000 mg BID in two weeks     Chief Complaint  Patient presents with  . Follow up on Cath/Stent  . Blood pressure going up and down     History of Present Illness:    Nicholas Love is a 62 y.o. male patient has past medical history of coronary artery disease and he was experiencing significant anginal symptoms for which I sent him for coronary angiography.  This revealed multiple areas of nonobstructive disease and also a significant obstructive lesion in the distal right coronary artery.  Patient underwent coronary stenting and subsequently has done fine and wants to get more active and wants a better quality of life.  At the time of my evaluation, the patient is alert awake oriented and in no distress.  Past Medical History:  Diagnosis Date  . Angina pectoris ([place])   . Arthritis   . Asthma   . Barrett esophagus   . Bronchiectasis ([place])   .  CAD (coronary artery disease)    a. prior mild RCAx2 and LAD stenting. b. same day PCI DES to dRCA 12/2017, 100% occ oD1 with L-L collaterals, otherwise nonobstructive disease, EF normal.  . COPD (chronic obstructive pulmonary disease) ([place])   . GERD (gastroesophageal reflux disease)   . Hiatal hernia   . Hyperlipemia   . Hypertension   . Myocardial infarct ([place])   . Sleep apnea   . SOB (shortness of breath)     Past Surgical History:  Procedure Laterality Date  . arthroscopic knee    . BACK SURGERY     x3 lower back  . CARDIAC CATHETERIZATION     3 stents  . CORONARY STENT INTERVENTION N/A 12/20/2017   Procedure: CORONARY STENT INTERVENTION;  Surgeon: Martinique, Peter M, MD;  Location: [facility];  Service: Cardiovascular;  Laterality: N/A;  . FRACTURE SURGERY  1970's   ankle  . LEFT HEART CATH AND CORONARY ANGIOGRAPHY N/A 12/20/2017   Procedure: LEFT HEART CATH AND CORONARY ANGIOGRAPHY;  Surgeon: Martinique, Peter M, MD;  Location: [facility];  Service: Cardiovascular;  Laterality: N/A;    Current Medications: Current Meds  Medication Sig  . albuterol (PROVENTIL) (2.5 MG/3ML) 0.083% nebulizer solution Take 3 mLs by nebulization every 4 (four) hours as needed for shortness of breath  . aspirin EC 81 MG tablet Take 81 mg by mouth every evening.   Marland Kitchen BREO ELLIPTA 100-25 MCG/INH AEPB Inhale 1 puff into the lungs daily.   . clopidogrel (PLAVIX) 75 MG tablet Take 75 mg by mouth every evening.   . diphenhydrAMINE (BENADRYL) 25 MG tablet Take 50 mg by mouth at bedtime as needed for allergies.  Marland Kitchen losartan-hydrochlorothiazide (HYZAAR) 100-12.5 MG tablet Take 1 tablet by mouth daily.  . Misc Natural Products (GLUCOSAMINE CHONDROITIN TRIPLE) TABS Take 1 tablet by mouth at bedtime.  . nitroGLYCERIN (NITROSTAT) 0.4 MG SL tablet 1 TABLET UNDER THE TONGUE AS NEEDED FOR CHEST PAIN UP TO 3 TABS, IF NO RESULT CALL 911  . Oxycodone HCl 10 MG TABS Take 10 mg by mouth every 6 (six) hours as needed (pain).  . pantoprazole (PROTONIX) 40 MG tablet Take 40 mg by mouth daily.   . rosuvastatin (CRESTOR) 40 MG tablet Take 40 mg by mouth every evening.   . Tiotropium Bromide Monohydrate (SPIRIVA RESPIMAT) 2.5 MCG/ACT AERS Inhale 2 puffs into the lungs daily.  Marland Kitchen tiZANidine (ZANAFLEX) 4 MG tablet Take 4 mg by mouth at bedtime.   . vitamin B-12 (CYANOCOBALAMIN) 1000 MCG tablet Take 2,000 mcg by mouth daily.   . [DISCONTINUED] metoprolol succinate (TOPROL-XL) 100 MG 24 hr tablet Take 100 mg by mouth daily.      Allergies:   Ceftriaxone; Cefdinir; and Ciprofloxacin   Social History   Socioeconomic History  . Marital status: Married    Spouse name: None  . Number of children: None  . Years  of education: None  . Highest education level: None  Social Needs  . Financial resource strain: None  . Food insecurity - worry: None  . Food insecurity - inability: None  . Transportation needs - medical: None  . Transportation needs - non-medical: None  Occupational History  . None  Tobacco Use  . Smoking status: Never Smoker  . Smokeless tobacco: Never Used  Substance and Sexual Activity  . Alcohol use: None  . Drug use: None  . Sexual activity: None  Other Topics Concern  . None  Social History Narrative  .  None     Family History: The patient's family history is not on file.  ROS:   Please see the history of present illness.    All other systems reviewed and are negative.  EKGs/Labs/Other Studies Reviewed:    The following studies were reviewed today: I discussed the findings of the test with the patient at extensive length.  Coronary angiography report was discussed and questions that he and his wife had were answered to their satisfaction.   Recent Labs: 12/21/2017: BUN 16; Creatinine, Ser 0.88; Hemoglobin 14.2; Platelets 166; Potassium 4.3; Sodium 137  Recent Lipid Panel No results found for: CHOL, TRIG, HDL, CHOLHDL, VLDL, LDLCALC, LDLDIRECT  Physical Exam:    VS:  BP (!) 162/74   Pulse 79   Ht 6' (1.829 m)   Wt 220 lb (99.8 kg)   SpO2 96%   BMI 29.84 kg/m     Wt Readings from Last 3 Encounters:  12/25/17 220 lb (99.8 kg)  12/21/17 216 lb 7.9 oz (98.2 kg)  12/17/17 221 lb 1.3 oz (100.3 kg)     GEN: Patient is in no acute distress HEENT: Normal NECK: No JVD; No carotid bruits LYMPHATICS: No lymphadenopathy CARDIAC: Hear sounds regular, 2/6 systolic murmur at the apex. RESPIRATORY:  Clear to auscultation without rales, wheezing or rhonchi  ABDOMEN: Soft, non-tender, non-distended MUSCULOSKELETAL:  No edema; No deformity  SKIN: Warm and dry NEUROLOGIC:  Alert and oriented x 3 PSYCHIATRIC:  Normal affect   Signed, Jenean Lindau, MD    12/25/2017 3:48 PM    [facility]

## 2017-12-25 NOTE — Patient Instructions (Addendum)
Medication Instructions:  Your physician has recommended you make the following change in your medication:  STOP metoprolol START bystolic 5 mg twice daily START ranexa 500 mg twice daily for 2 weeks then change to 1,000 mg twice daily  Labwork: None  Testing/Procedures: None  Follow-Up: Your physician recommends that you schedule a follow-up appointment in: February  Any Other Special Instructions Will Be Listed Below (If Applicable).  Cardiac rehab referral will be made to [facility]   If you need a refill on your cardiac medications before your next appointment, please call your pharmacy.   [place], RN, BSN   Nebivolol oral tablets What is this medicine? NEBIVOLOL (ne BIV oh lol) is a beta-blocker. Beta-blockers reduce the workload on the heart and help it to beat more regularly. This medicine is used to treat high blood pressure. This medicine may be used for other purposes; ask your health care provider or pharmacist if you have questions. COMMON BRAND NAME(S): Bystolic What should I tell my health care provider before I take this medicine? They need to know if you have any of these conditions: -diabetes -heart or vessel disease like slow heartrate, worsening heart failure, heart block, sick sinus syndrome or Raynaud's disease -kidney disease -liver disease -lung disease like asthma or emphysema -pheochromocytoma -thyroid disease -an unusual or allergic reaction to nebivolol, other beta-blockers, medicines, foods, dyes, or preservatives -pregnant or trying to get pregnant -breast-feeding How should I use this medicine? Take this medicine by mouth with a glass of water. Follow the directions on the prescription label. You can take this medicine with or without food. Take your doses at regular intervals. Do not take your medicine more often than directed. Do not stop taking this medicine suddenly. This could lead to serious heart-related  effects. Talk to your pediatrician regarding the use of this medicine in children. Special care may be needed. Overdosage: If you think you have taken too much of this medicine contact a poison control center or emergency room at once. NOTE: This medicine is only for you. Do not share this medicine with others. What if I miss a dose? If you miss a dose, take it as soon as you can. If it is almost time for your next dose, take only that dose. Do not take double or extra doses. What may interact with this medicine? This medicine may interact with the following medications: -certain medicines for blood pressure, heart disease, irregular heart beat -certain medicines for depression, like fluoxetine or paroxetine -cimetidine -clonidine -reserpine -sildenafil This list may not describe all possible interactions. Give your health care provider a list of all the medicines, herbs, non-prescription drugs, or dietary supplements you use. Also tell them if you smoke, drink alcohol, or use illegal drugs. Some items may interact with your medicine. What should I watch for while using this medicine? Visit your doctor or health care professional for regular checks on your progress. Check your heart rate and blood pressure regularly while you are taking this medicine. Ask your doctor or health care professional what your heart rate and blood pressure should be, and when you should contact him or her. You may get drowsy or dizzy. Do not drive, use machinery, or do anything that needs mental alertness until you know how this drug affects you. Do not stand or sit up quickly, especially if you are an older patient. This reduces the risk of dizzy or fainting spells. Alcohol can make you more drowsy and  dizzy. Avoid alcoholic drinks. This medicine can affect blood sugar levels. If you have diabetes, check with your doctor or health care professional before you change your diet or the dose of your diabetic medicine. Do  not treat yourself for coughs, colds, or pain while you are taking this medicine without asking your doctor or health care professional for advice. Some ingredients may increase your blood pressure. What side effects may I notice from receiving this medicine? Side effects that you should report to your doctor or health care professional as soon as possible: -allergic reactions like skin rash, itching or hives, swelling of the face, lips, or tongue -breathing problems -chest pain -cold, tingling, or numb hands or feet -dark urine -general ill feeling or flu-like symptoms -irregular heartbeat -light-colored stools -loss of appetite, nausea -right upper belly pain -slow heart rate -swollen legs or ankles -unusual bleeding or bruising -unusually weak or tired -vomiting -yellowing of the eyes or skin Side effects that usually do not require medical attention (report to your doctor or health care professional if they continue or are bothersome): -diarrhea -dizziness -dry or burning eyes -headache -nausea -tiredness -trouble sleeping This list may not describe all possible side effects. Call your doctor for medical advice about side effects. You may report side effects to FDA at 1-800-FDA-1088. Where should I keep my medicine? Keep out of the reach of children. Store at room temperature between 20 and 25 degrees C (68 and 77 degrees F). Protect from light. Keep container tightly closed. Throw away any unused medicine after the expiration date. NOTE: This sheet is a summary. It may not cover all possible information. If you have questions about this medicine, talk to your doctor, pharmacist, or health care provider.  2018 Elsevier/Gold Standard (2013-07-31 14:46:00) Ranolazine tablets, extended release What is this medicine? RANOLAZINE (ra NOE la zeen) is a heart medicine. It is used to treat chronic chest pain (angina). This medicine must be taken regularly. It will not relieve an acute  episode of chest pain. This medicine may be used for other purposes; ask your health care provider or pharmacist if you have questions. COMMON BRAND NAME(S): Ranexa What should I tell my health care provider before I take this medicine? They need to know if you have any of these conditions: -heart disease -irregular heartbeat -kidney disease -liver disease -low levels of potassium or magnesium in the blood -an unusual or allergic reaction to ranolazine, other medicines, foods, dyes, or preservatives -pregnant or trying to get pregnant -breast-feeding How should I use this medicine? Take this medicine by mouth with a glass of water. Follow the directions on the prescription label. Do not cut, crush, or chew this medicine. Take with or without food. Do not take this medication with grapefruit juice. Take your doses at regular intervals. Do not take your medicine more often then directed. Talk to your pediatrician regarding the use of this medicine in children. Special care may be needed. Overdosage: If you think you have taken too much of this medicine contact a poison control center or emergency room at once. NOTE: This medicine is only for you. Do not share this medicine with others. What if I miss a dose? If you miss a dose, take it as soon as you can. If it is almost time for your next dose, take only that dose. Do not take double or extra doses. What may interact with this medicine? Do not take this medicine with any of the following medications: -antivirals for HIV  or AIDS -cerivastatin -certain antibiotics like chloramphenicol, clarithromycin, dalfopristin; quinupristin, isoniazid, rifabutin, rifampin, rifapentine -certain medicines used for cancer like imatinib, nilotinib -certain medicines for fungal infections like fluconazole, itraconazole, ketoconazole, posaconazole, voriconazole -certain medicines for irregular heart beat like dofetilide, dronedarone -certain medicines for  seizures like carbamazepine, fosphenytoin, oxcarbazepine, phenobarbital, phenytoin -cisapride -conivaptan -cyclosporine -grapefruit or grapefruit juice -lumacaftor; ivacaftor -nefazodone -pimozide -quinacrine -St John's wort -thioridazine -ziprasidone This medicine may also interact with the following medications: -alfuzosin -certain medicines for depression, anxiety, or psychotic disturbances like bupropion, citalopram, fluoxetine, fluphenazine, paroxetine, perphenazine, risperidone, sertraline, trifluoperazine -certain medicines for cholesterol like atorvastatin, lovastatin, simvastatin -certain medicines for stomach problems like octreotide, palonosetron, prochlorperazine -eplerenone -ergot alkaloids like dihydroergotamine, ergonovine, ergotamine, methylergonovine -metformin -nicardipine -other medicines that prolong the QT interval (cause an abnormal heart rhythm) -sirolimus -tacrolimus This list may not describe all possible interactions. Give your health care provider a list of all the medicines, herbs, non-prescription drugs, or dietary supplements you use. Also tell them if you smoke, drink alcohol, or use illegal drugs. Some items may interact with your medicine. What should I watch for while using this medicine? Visit your doctor for regular check ups. Tell your doctor or healthcare professional if your symptoms do not start to get better or if they get worse. This medicine will not relieve an acute attack of angina or chest pain. This medicine can change your heart rhythm. Your health care provider may check your heart rhythm by ordering an electrocardiogram (ECG) while you are taking this medicine. You may get drowsy or dizzy. Do not drive, use machinery, or do anything that needs mental alertness until you know how this medicine affects you. Do not stand or sit up quickly, especially if you are an older patient. This reduces the risk of dizzy or fainting spells. Alcohol may  interfere with the effect of this medicine. Avoid alcoholic drinks. If you are scheduled for any medical or dental procedure, tell your healthcare provider that you are taking this medicine. This medicine can interact with other medicines used during surgery. What side effects may I notice from receiving this medicine? Side effects that you should report to your doctor or health care professional as soon as possible: -allergic reactions like skin rash, itching or hives, swelling of the face, lips, or tongue -breathing problems -changes in vision -fast, irregular or pounding heartbeat -feeling faint or lightheaded, falls -low or high blood pressure -numbness or tingling feelings -ringing in the ears -tremor or shakiness -slow heartbeat (fewer than 50 beats per minute) -swelling of the legs or feet Side effects that usually do not require medical attention (report to your doctor or health care professional if they continue or are bothersome): -constipation -drowsy -dry mouth -headache -nausea or vomiting -stomach upset This list may not describe all possible side effects. Call your doctor for medical advice about side effects. You may report side effects to FDA at 1-800-FDA-1088. Where should I keep my medicine? Keep out of the reach of children. Store at room temperature between 15 and 30 degrees C (59 and 86 degrees F). Throw away any unused medicine after the expiration date. NOTE: This sheet is a summary. It may not cover all possible information. If you have questions about this medicine, talk to your doctor, pharmacist, or health care provider.  2018 Elsevier/Gold Standard (2015-12-29 12:24:15)

## 2017-12-30 ENCOUNTER — Ambulatory Visit: Payer: PPO | Admitting: Cardiology

## 2017-12-30 DIAGNOSIS — J471 Bronchiectasis with (acute) exacerbation: Secondary | ICD-10-CM | POA: Diagnosis not present

## 2018-01-17 ENCOUNTER — Ambulatory Visit (INDEPENDENT_AMBULATORY_CARE_PROVIDER_SITE_OTHER): Payer: PPO | Admitting: Cardiology

## 2018-01-17 ENCOUNTER — Encounter: Payer: Self-pay | Admitting: Cardiology

## 2018-01-17 ENCOUNTER — Ambulatory Visit: Payer: PPO | Admitting: Cardiology

## 2018-01-17 VITALS — BP 138/70 | HR 77 | Ht 73.0 in | Wt 214.0 lb

## 2018-01-17 DIAGNOSIS — Z5181 Encounter for therapeutic drug level monitoring: Secondary | ICD-10-CM

## 2018-01-17 DIAGNOSIS — Z1329 Encounter for screening for other suspected endocrine disorder: Secondary | ICD-10-CM | POA: Diagnosis not present

## 2018-01-17 DIAGNOSIS — I10 Essential (primary) hypertension: Secondary | ICD-10-CM | POA: Insufficient documentation

## 2018-01-17 DIAGNOSIS — I2510 Atherosclerotic heart disease of native coronary artery without angina pectoris: Secondary | ICD-10-CM

## 2018-01-17 DIAGNOSIS — E782 Mixed hyperlipidemia: Secondary | ICD-10-CM

## 2018-01-17 DIAGNOSIS — Z79899 Other long term (current) drug therapy: Secondary | ICD-10-CM | POA: Diagnosis not present

## 2018-01-17 DIAGNOSIS — G4730 Sleep apnea, unspecified: Secondary | ICD-10-CM

## 2018-01-17 HISTORY — DX: Mixed hyperlipidemia: E78.2

## 2018-01-17 HISTORY — DX: Essential (primary) hypertension: I10

## 2018-01-17 MED ORDER — METOPROLOL SUCCINATE ER 100 MG PO TB24: 100.0000 mg | ORAL_TABLET | Freq: Every day | ORAL | 1 refills | Status: DC

## 2018-01-17 NOTE — Progress Notes (Signed)
Cardiology Office Note:    Date:  01/17/2018   ID:  Nicholas Love, DOB Nov 14, 1956, MRN [id number]  PCP:  Ocie Doyne., MD  Cardiologist:  Jenean Lindau, MD   Referring MD: Ocie Doyne., MD    ASSESSMENT:    1. Coronary artery disease involving native coronary artery of native heart without angina pectoris   2. Sleep apnea, unspecified type   3. Essential hypertension   4. Mixed dyslipidemia    PLAN:    In order of problems listed above:  1. Secondary prevention stressed with the patient.  Importance of compliance with diet and medications stressed and he vocalized understanding.  We will stop the medications he has requested and initiate him back on the metoprolol. 2. I congratulated him about his diet and exercise program.  I told him that he needs to walk 30 minutes a day 5 days a week at least.  He is okay with that point. 3. He will have blood work today.  He is fasting and we will check lipids also. 4. Patient will be seen in follow-up appointment in 3 months or earlier if the patient has any concerns     Medication Adjustments/Labs and Tests Ordered: Current medicines are reviewed at length with the patient today.  Concerns regarding medicines are outlined above.  No orders of the defined types were placed in this encounter.  No orders of the defined types were placed in this encounter.    Chief Complaint  Patient presents with  . Follow-up  . Coronary Artery Disease     History of Present Illness:    Nicholas Love is a 62 y.o. male.  The patient has known coronary artery disease, essential hypertension and dyslipidemia.  He underwent coronary angiography and has significant disease.  We are trying to be aggressive with medical management.  Patient was initiated on by systolic but he finds it very expensive.  He tells me that ranolazine makes him feel very tired.  No chest pain orthopnea or PND.  He walks about 20 minutes regularly without any problems.  At  the time of my evaluation, the patient is alert awake oriented and in no distress.  Past Medical History:  Diagnosis Date  . Angina pectoris ([place])   . Arthritis   . Asthma   . Barrett esophagus   . Bronchiectasis ([place])   . CAD (coronary artery disease)    a. prior mild RCAx2 and LAD stenting. b. same day PCI DES to dRCA 12/2017, 100% occ oD1 with L-L collaterals, otherwise nonobstructive disease, EF normal.  . COPD (chronic obstructive pulmonary disease) ([place])   . GERD (gastroesophageal reflux disease)   . Hiatal hernia   . Hyperlipemia   . Hypertension   . Myocardial infarct ([place])   . Sleep apnea   . SOB (shortness of breath)     Past Surgical History:  Procedure Laterality Date  . arthroscopic knee    . BACK SURGERY     x3 lower back  . CARDIAC CATHETERIZATION     3 stents  . CORONARY STENT INTERVENTION N/A 12/20/2017   Procedure: CORONARY STENT INTERVENTION;  Surgeon: Martinique, Peter M, MD;  Location: [facility];  Service: Cardiovascular;  Laterality: N/A;  . FRACTURE SURGERY  1970's   ankle  . LEFT HEART CATH AND CORONARY ANGIOGRAPHY N/A 12/20/2017   Procedure: LEFT HEART CATH AND CORONARY ANGIOGRAPHY;  Surgeon: Martinique, Peter M, MD;  Location: [facility];  Service: Cardiovascular;  Laterality: N/A;    Current Medications: Current Meds  Medication Sig  . albuterol (PROVENTIL) (2.5 MG/3ML) 0.083% nebulizer solution Take 3 mLs by nebulization every 4 (four) hours as needed for shortness of breath  . aspirin EC 81 MG tablet Take 81 mg by mouth every evening.   Marland Kitchen BREO ELLIPTA 100-25 MCG/INH AEPB Inhale 1 puff into the lungs daily.   . clopidogrel (PLAVIX) 75 MG tablet Take 75 mg by mouth every evening.   . diphenhydrAMINE (BENADRYL) 25 MG tablet Take 50 mg by mouth at bedtime as needed for allergies.  Marland Kitchen losartan-hydrochlorothiazide (HYZAAR) 100-12.5 MG tablet Take 1 tablet by mouth daily.  . Misc Natural Products (GLUCOSAMINE CHONDROITIN TRIPLE) TABS Take 1  tablet by mouth at bedtime.  . nitroGLYCERIN (NITROSTAT) 0.4 MG SL tablet 1 TABLET UNDER THE TONGUE AS NEEDED FOR CHEST PAIN UP TO 3 TABS, IF NO RESULT CALL 911  . Oxycodone HCl 10 MG TABS Take 10 mg by mouth every 6 (six) hours as needed (pain).  . pantoprazole (PROTONIX) 40 MG tablet Take 40 mg by mouth daily.   . rosuvastatin (CRESTOR) 40 MG tablet Take 40 mg by mouth every evening.   . Tiotropium Bromide Monohydrate (SPIRIVA RESPIMAT) 2.5 MCG/ACT AERS Inhale 2 puffs into the lungs daily.  Marland Kitchen tiZANidine (ZANAFLEX) 4 MG tablet Take 4 mg by mouth at bedtime.   . vitamin B-12 (CYANOCOBALAMIN) 1000 MCG tablet Take 2,000 mcg by mouth daily.   . [DISCONTINUED] nebivolol (BYSTOLIC) 5 MG tablet Take 1 tablet (5 mg total) by mouth 2 (two) times daily.  . [DISCONTINUED] ranolazine (RANEXA) 500 MG 12 hr tablet Take 1 tablet (500 mg total) by mouth 2 (two) times daily.     Allergies:   Ceftriaxone; Cefdinir; and Ciprofloxacin   Social History   Socioeconomic History  . Marital status: Married    Spouse name: None  . Number of children: None  . Years of education: None  . Highest education level: None  Social Needs  . Financial resource strain: None  . Food insecurity - worry: None  . Food insecurity - inability: None  . Transportation needs - medical: None  . Transportation needs - non-medical: None  Occupational History  . None  Tobacco Use  . Smoking status: Never Smoker  . Smokeless tobacco: Never Used  Substance and Sexual Activity  . Alcohol use: None  . Drug use: None  . Sexual activity: None  Other Topics Concern  . None  Social History Narrative  . None     Family History: The patient's family history is not on file.  ROS:   Please see the history of present illness.    All other systems reviewed and are negative.  EKGs/Labs/Other Studies Reviewed:    The following studies were reviewed today: I discussed my evaluation and findings today with the patient is doing  well walking and also has reduced his weight significantly.   Recent Labs: 12/21/2017: BUN 16; Creatinine, Ser 0.88; Hemoglobin 14.2; Platelets 166; Potassium 4.3; Sodium 137  Recent Lipid Panel No results found for: CHOL, TRIG, HDL, CHOLHDL, VLDL, LDLCALC, LDLDIRECT  Physical Exam:    VS:  BP 138/70 (BP Location: Right Arm, Patient Position: Sitting, Cuff Size: Normal)   Pulse 77   Ht 6\' 1"  (1.854 m)   Wt 214 lb (97.1 kg)   SpO2 96%   BMI 28.23 kg/m     Wt Readings from Last 3 Encounters:  01/17/18 214 lb (  97.1 kg)  12/25/17 220 lb (99.8 kg)  12/21/17 216 lb 7.9 oz (98.2 kg)     GEN: Patient is in no acute distress HEENT: Normal NECK: No JVD; No carotid bruits LYMPHATICS: No lymphadenopathy CARDIAC: Hear sounds regular, 2/6 systolic murmur at the apex. RESPIRATORY:  Clear to auscultation without rales, wheezing or rhonchi  ABDOMEN: Soft, non-tender, non-distended MUSCULOSKELETAL:  No edema; No deformity  SKIN: Warm and dry NEUROLOGIC:  Alert and oriented x 3 PSYCHIATRIC:  Normal affect   Signed, Jenean Lindau, MD  01/17/2018 9:04 AM    [place]

## 2018-01-17 NOTE — Patient Instructions (Signed)
Medication Instructions:  Your physician has recommended you make the following change in your medication:  STOP bystolic and ranexa RESUME toprol 100 mg daily  Labwork: Your physician recommends that you have the following labs drawn: BMP, Mag, CBC, TSH and liver/lipid panel  Testing/Procedures: None  Follow-Up: Your physician recommends that you schedule a follow-up appointment in: 3 months  Any Other Special Instructions Will Be Listed Below (If Applicable).     If you need a refill on your cardiac medications before your next appointment, please call your pharmacy.   [place], RN, BSN

## 2018-01-18 LAB — HEPATIC FUNCTION PANEL
ALBUMIN: 4.2 g/dL (ref 3.6–4.8)
ALT: 12 IU/L (ref 0–44)
AST: 19 IU/L (ref 0–40)
Alkaline Phosphatase: 99 IU/L (ref 39–117)
BILIRUBIN TOTAL: 0.4 mg/dL (ref 0.0–1.2)
Bilirubin, Direct: 0.09 mg/dL (ref 0.00–0.40)
TOTAL PROTEIN: 6.9 g/dL (ref 6.0–8.5)

## 2018-01-18 LAB — BASIC METABOLIC PANEL
BUN / CREAT RATIO: 27 — AB (ref 10–24)
BUN: 26 mg/dL (ref 8–27)
CHLORIDE: 104 mmol/L (ref 96–106)
CO2: 23 mmol/L (ref 20–29)
Calcium: 9.6 mg/dL (ref 8.6–10.2)
Creatinine, Ser: 0.95 mg/dL (ref 0.76–1.27)
GFR calc Af Amer: 98 mL/min/{1.73_m2} (ref >59)
GFR calc non Af Amer: 85 mL/min/{1.73_m2} (ref >59)
GLUCOSE: 132 mg/dL — AB (ref 65–99)
Potassium: 4 mmol/L (ref 3.5–5.2)
Sodium: 145 mmol/L — ABNORMAL HIGH (ref 134–144)

## 2018-01-18 LAB — CBC WITH DIFFERENTIAL/PLATELET
BASOS ABS: 0 10*3/uL (ref 0.0–0.2)
Basos: 0 %
EOS (ABSOLUTE): 0.1 10*3/uL (ref 0.0–0.4)
Eos: 2 %
HEMOGLOBIN: 14.7 g/dL (ref 13.0–17.7)
Hematocrit: 42.7 % (ref 37.5–51.0)
Immature Grans (Abs): 0 10*3/uL (ref 0.0–0.1)
Immature Granulocytes: 0 %
LYMPHS ABS: 0.9 10*3/uL (ref 0.7–3.1)
Lymphs: 12 %
MCH: 29.3 pg (ref 26.6–33.0)
MCHC: 34.4 g/dL (ref 31.5–35.7)
MCV: 85 fL (ref 79–97)
MONOCYTES: 8 %
Monocytes Absolute: 0.6 10*3/uL (ref 0.1–0.9)
Neutrophils Absolute: 5.6 10*3/uL (ref 1.4–7.0)
Neutrophils: 78 %
Platelets: 186 10*3/uL (ref 150–379)
RBC: 5.02 x10E6/uL (ref 4.14–5.80)
RDW: 15.6 % — ABNORMAL HIGH (ref 12.3–15.4)
WBC: 7.2 10*3/uL (ref 3.4–10.8)

## 2018-01-18 LAB — LIPID PANEL
CHOL/HDL RATIO: 4.4 ratio (ref 0.0–5.0)
Cholesterol, Total: 184 mg/dL (ref 100–199)
HDL: 42 mg/dL (ref >39)
LDL Calculated: 117 mg/dL — ABNORMAL HIGH (ref 0–99)
Triglycerides: 123 mg/dL (ref 0–149)
VLDL CHOLESTEROL CAL: 25 mg/dL (ref 5–40)

## 2018-01-18 LAB — MAGNESIUM: MAGNESIUM: 2 mg/dL (ref 1.6–2.3)

## 2018-01-18 LAB — TSH: TSH: 0.581 u[IU]/mL (ref 0.450–4.500)

## 2018-01-20 ENCOUNTER — Other Ambulatory Visit: Payer: Self-pay

## 2018-01-20 MED ORDER — EZETIMIBE 10 MG PO TABS: 10.0000 mg | ORAL_TABLET | Freq: Every day | ORAL | 1 refills | Status: DC

## 2018-01-20 NOTE — Addendum Note (Signed)
Addended by: Mattie Marlin on: 01/20/2018 03:23 PM   Modules accepted: Orders

## 2018-01-21 ENCOUNTER — Telehealth: Payer: Self-pay | Admitting: *Deleted

## 2018-01-21 NOTE — Telephone Encounter (Signed)
OPTIMIZE research study 30 day phone call attempted. Left message for patient to call research office.

## 2018-01-22 ENCOUNTER — Encounter: Payer: Self-pay | Admitting: *Deleted

## 2018-01-22 DIAGNOSIS — Z006 Encounter for examination for normal comparison and control in clinical research program: Secondary | ICD-10-CM

## 2018-01-22 NOTE — Progress Notes (Signed)
OPTIMIZE Research study 30 day telephone follow up completed. Patient denies any Chest pain or other adverse events. He has had some medication changes at his request. (see OV note). His DAPT therapy continues with ASA and Plavix with good compliance. Next research required visit is due between 10/Jun/19-09/Aug/19. I thanks the patient for his participation. Questions encouraged and answered.

## 2018-01-30 DIAGNOSIS — J471 Bronchiectasis with (acute) exacerbation: Secondary | ICD-10-CM | POA: Diagnosis not present

## 2018-02-27 DIAGNOSIS — J471 Bronchiectasis with (acute) exacerbation: Secondary | ICD-10-CM | POA: Diagnosis not present

## 2018-03-07 ENCOUNTER — Telehealth: Payer: Self-pay

## 2018-03-07 NOTE — Telephone Encounter (Signed)
Left voicemail informing patient that he may have his labwork completed at the [facility] in [place].

## 2018-03-11 DIAGNOSIS — R51 Headache: Secondary | ICD-10-CM | POA: Diagnosis not present

## 2018-03-11 DIAGNOSIS — M542 Cervicalgia: Secondary | ICD-10-CM | POA: Diagnosis not present

## 2018-03-11 DIAGNOSIS — I252 Old myocardial infarction: Secondary | ICD-10-CM | POA: Diagnosis not present

## 2018-03-11 DIAGNOSIS — S161XXA Strain of muscle, fascia and tendon at neck level, initial encounter: Secondary | ICD-10-CM | POA: Diagnosis not present

## 2018-03-11 DIAGNOSIS — R0602 Shortness of breath: Secondary | ICD-10-CM | POA: Diagnosis not present

## 2018-03-11 DIAGNOSIS — I10 Essential (primary) hypertension: Secondary | ICD-10-CM | POA: Diagnosis not present

## 2018-03-11 DIAGNOSIS — R0789 Other chest pain: Secondary | ICD-10-CM | POA: Diagnosis not present

## 2018-03-11 DIAGNOSIS — R079 Chest pain, unspecified: Secondary | ICD-10-CM | POA: Diagnosis not present

## 2018-03-14 DIAGNOSIS — E538 Deficiency of other specified B group vitamins: Secondary | ICD-10-CM | POA: Diagnosis not present

## 2018-03-14 DIAGNOSIS — M545 Low back pain: Secondary | ICD-10-CM | POA: Diagnosis not present

## 2018-03-14 DIAGNOSIS — Z6828 Body mass index (BMI) 28.0-28.9, adult: Secondary | ICD-10-CM | POA: Diagnosis not present

## 2018-03-14 DIAGNOSIS — E782 Mixed hyperlipidemia: Secondary | ICD-10-CM | POA: Diagnosis not present

## 2018-03-14 DIAGNOSIS — M5090 Cervical disc disorder, unspecified, unspecified cervical region: Secondary | ICD-10-CM | POA: Diagnosis not present

## 2018-03-14 DIAGNOSIS — E559 Vitamin D deficiency, unspecified: Secondary | ICD-10-CM | POA: Diagnosis not present

## 2018-03-14 DIAGNOSIS — J449 Chronic obstructive pulmonary disease, unspecified: Secondary | ICD-10-CM | POA: Diagnosis not present

## 2018-03-14 DIAGNOSIS — I2510 Atherosclerotic heart disease of native coronary artery without angina pectoris: Secondary | ICD-10-CM | POA: Diagnosis not present

## 2018-03-14 DIAGNOSIS — G4730 Sleep apnea, unspecified: Secondary | ICD-10-CM | POA: Diagnosis not present

## 2018-03-14 DIAGNOSIS — E118 Type 2 diabetes mellitus with unspecified complications: Secondary | ICD-10-CM | POA: Diagnosis not present

## 2018-03-14 DIAGNOSIS — J479 Bronchiectasis, uncomplicated: Secondary | ICD-10-CM | POA: Diagnosis not present

## 2018-03-30 DIAGNOSIS — J471 Bronchiectasis with (acute) exacerbation: Secondary | ICD-10-CM | POA: Diagnosis not present

## 2018-04-09 DIAGNOSIS — J449 Chronic obstructive pulmonary disease, unspecified: Secondary | ICD-10-CM | POA: Diagnosis not present

## 2018-04-09 DIAGNOSIS — J471 Bronchiectasis with (acute) exacerbation: Secondary | ICD-10-CM | POA: Diagnosis not present

## 2018-04-29 DIAGNOSIS — J471 Bronchiectasis with (acute) exacerbation: Secondary | ICD-10-CM | POA: Diagnosis not present

## 2018-05-02 ENCOUNTER — Telehealth: Payer: Self-pay

## 2018-05-02 NOTE — Telephone Encounter (Signed)
Called patient regarding overdue labs and patient  wasn't present so left a  voicemail 

## 2018-05-21 ENCOUNTER — Encounter: Payer: Self-pay | Admitting: *Deleted

## 2018-05-21 DIAGNOSIS — Z006 Encounter for examination for normal comparison and control in clinical research program: Secondary | ICD-10-CM

## 2018-05-21 NOTE — Progress Notes (Signed)
[place] study 6 month telephone follow up completed. Patient denies any chest pain or other adverse events. No changes in medication. Next research required visit due between 12/Dec/19- 10/feb/2020. An EKG and exam will be needed at that visit.

## 2018-05-30 DIAGNOSIS — J471 Bronchiectasis with (acute) exacerbation: Secondary | ICD-10-CM | POA: Diagnosis not present

## 2018-06-16 DIAGNOSIS — K210 Gastro-esophageal reflux disease with esophagitis: Secondary | ICD-10-CM | POA: Diagnosis not present

## 2018-06-16 DIAGNOSIS — I10 Essential (primary) hypertension: Secondary | ICD-10-CM | POA: Diagnosis not present

## 2018-06-16 DIAGNOSIS — F1010 Alcohol abuse, uncomplicated: Secondary | ICD-10-CM | POA: Diagnosis not present

## 2018-06-16 DIAGNOSIS — Z1339 Encounter for screening examination for other mental health and behavioral disorders: Secondary | ICD-10-CM | POA: Diagnosis not present

## 2018-06-16 DIAGNOSIS — G4730 Sleep apnea, unspecified: Secondary | ICD-10-CM | POA: Diagnosis not present

## 2018-06-16 DIAGNOSIS — E559 Vitamin D deficiency, unspecified: Secondary | ICD-10-CM | POA: Diagnosis not present

## 2018-06-16 DIAGNOSIS — E118 Type 2 diabetes mellitus with unspecified complications: Secondary | ICD-10-CM | POA: Diagnosis not present

## 2018-06-16 DIAGNOSIS — E782 Mixed hyperlipidemia: Secondary | ICD-10-CM | POA: Diagnosis not present

## 2018-06-16 DIAGNOSIS — J449 Chronic obstructive pulmonary disease, unspecified: Secondary | ICD-10-CM | POA: Diagnosis not present

## 2018-06-16 DIAGNOSIS — M545 Low back pain: Secondary | ICD-10-CM | POA: Diagnosis not present

## 2018-06-16 DIAGNOSIS — I2510 Atherosclerotic heart disease of native coronary artery without angina pectoris: Secondary | ICD-10-CM | POA: Diagnosis not present

## 2018-06-16 DIAGNOSIS — Z79899 Other long term (current) drug therapy: Secondary | ICD-10-CM | POA: Diagnosis not present

## 2018-06-16 DIAGNOSIS — J479 Bronchiectasis, uncomplicated: Secondary | ICD-10-CM | POA: Diagnosis not present

## 2018-06-29 DIAGNOSIS — J471 Bronchiectasis with (acute) exacerbation: Secondary | ICD-10-CM | POA: Diagnosis not present

## 2018-07-02 ENCOUNTER — Telehealth: Payer: Self-pay | Admitting: *Deleted

## 2018-07-02 DIAGNOSIS — E782 Mixed hyperlipidemia: Secondary | ICD-10-CM

## 2018-07-02 NOTE — Telephone Encounter (Signed)
Left detailed message on cell phone per [facility] regarding lab results. Advised patient that Dr. Geraldo Pitter wants him to continue taking his medications as prescribed and provided education regarding diet choices to reduce cholesterol levels. Informed patient to have repeat lab work drawn in 3 months to recheck cholesterol. Advised patient to contact our office with any questions or concerns.

## 2018-07-18 ENCOUNTER — Other Ambulatory Visit: Payer: Self-pay | Admitting: Cardiology

## 2018-07-30 DIAGNOSIS — J471 Bronchiectasis with (acute) exacerbation: Secondary | ICD-10-CM | POA: Diagnosis not present

## 2018-08-19 ENCOUNTER — Other Ambulatory Visit: Payer: Self-pay | Admitting: Cardiology

## 2018-08-30 DIAGNOSIS — J471 Bronchiectasis with (acute) exacerbation: Secondary | ICD-10-CM | POA: Diagnosis not present

## 2018-09-16 DIAGNOSIS — E782 Mixed hyperlipidemia: Secondary | ICD-10-CM | POA: Diagnosis not present

## 2018-09-16 DIAGNOSIS — E118 Type 2 diabetes mellitus with unspecified complications: Secondary | ICD-10-CM | POA: Diagnosis not present

## 2018-09-16 DIAGNOSIS — Z6828 Body mass index (BMI) 28.0-28.9, adult: Secondary | ICD-10-CM | POA: Diagnosis not present

## 2018-09-16 DIAGNOSIS — M545 Low back pain: Secondary | ICD-10-CM | POA: Diagnosis not present

## 2018-09-16 DIAGNOSIS — I2510 Atherosclerotic heart disease of native coronary artery without angina pectoris: Secondary | ICD-10-CM | POA: Diagnosis not present

## 2018-09-16 DIAGNOSIS — I10 Essential (primary) hypertension: Secondary | ICD-10-CM | POA: Diagnosis not present

## 2018-09-16 DIAGNOSIS — Z125 Encounter for screening for malignant neoplasm of prostate: Secondary | ICD-10-CM | POA: Diagnosis not present

## 2018-09-29 DIAGNOSIS — J471 Bronchiectasis with (acute) exacerbation: Secondary | ICD-10-CM | POA: Diagnosis not present

## 2018-10-07 DIAGNOSIS — J449 Chronic obstructive pulmonary disease, unspecified: Secondary | ICD-10-CM | POA: Diagnosis not present

## 2018-10-07 DIAGNOSIS — Z6828 Body mass index (BMI) 28.0-28.9, adult: Secondary | ICD-10-CM | POA: Diagnosis not present

## 2018-10-07 DIAGNOSIS — E663 Overweight: Secondary | ICD-10-CM | POA: Diagnosis not present

## 2018-10-07 DIAGNOSIS — J189 Pneumonia, unspecified organism: Secondary | ICD-10-CM | POA: Diagnosis not present

## 2018-10-21 DIAGNOSIS — G4733 Obstructive sleep apnea (adult) (pediatric): Secondary | ICD-10-CM | POA: Diagnosis not present

## 2018-10-30 DIAGNOSIS — J471 Bronchiectasis with (acute) exacerbation: Secondary | ICD-10-CM | POA: Diagnosis not present

## 2018-11-12 ENCOUNTER — Telehealth: Payer: Self-pay

## 2018-11-12 NOTE — Telephone Encounter (Signed)
Called pt in an attempt to schedule 12 month follow up appointment for the research/ Optimize study. Left voicemail to return call to schedule.

## 2018-11-20 ENCOUNTER — Telehealth: Payer: Self-pay

## 2018-11-20 NOTE — Telephone Encounter (Signed)
2nd attempt to call and schedule 12 month f/u for the Optimize research study. Left voicemail.

## 2018-11-29 DIAGNOSIS — J471 Bronchiectasis with (acute) exacerbation: Secondary | ICD-10-CM | POA: Diagnosis not present

## 2018-12-09 DIAGNOSIS — B370 Candidal stomatitis: Secondary | ICD-10-CM | POA: Diagnosis not present

## 2018-12-09 DIAGNOSIS — Z6828 Body mass index (BMI) 28.0-28.9, adult: Secondary | ICD-10-CM | POA: Diagnosis not present

## 2018-12-09 DIAGNOSIS — R6889 Other general symptoms and signs: Secondary | ICD-10-CM | POA: Diagnosis not present

## 2018-12-09 DIAGNOSIS — J189 Pneumonia, unspecified organism: Secondary | ICD-10-CM | POA: Diagnosis not present

## 2018-12-09 DIAGNOSIS — J449 Chronic obstructive pulmonary disease, unspecified: Secondary | ICD-10-CM | POA: Diagnosis not present

## 2018-12-23 ENCOUNTER — Encounter: Payer: PPO | Admitting: *Deleted

## 2018-12-23 VITALS — BP 150/82 | HR 71 | Resp 16

## 2018-12-23 DIAGNOSIS — Z006 Encounter for examination for normal comparison and control in clinical research program: Secondary | ICD-10-CM

## 2018-12-25 NOTE — Research (Signed)
Optimize:  Pt was seen today for 41M follow up. No complaints of chest pains or sob. His only complaint was his back was hurting from driving.  No other problems  EKG from today:   Current Outpatient Medications:  .  albuterol (PROVENTIL) (2.5 MG/3ML) 0.083% nebulizer solution, Take 3 mLs by nebulization every 4 (four) hours as needed for shortness of breath, Disp: , Rfl:  .  aspirin EC 81 MG tablet, Take 81 mg by mouth every evening. , Disp: , Rfl:  .  BREO ELLIPTA 100-25 MCG/INH AEPB, Inhale 1 puff into the lungs daily. , Disp: , Rfl:  .  clopidogrel (PLAVIX) 75 MG tablet, Take 75 mg by mouth every evening. , Disp: , Rfl:  .  diphenhydrAMINE (BENADRYL) 25 MG tablet, Take 50 mg by mouth at bedtime as needed for allergies., Disp: , Rfl:  .  losartan-hydrochlorothiazide (HYZAAR) 100-12.5 MG tablet, Take 1 tablet by mouth daily., Disp: , Rfl:  .  metoprolol succinate (TOPROL-XL) 100 MG 24 hr tablet, TAKE ONE TABLET BY MOUTH ONCE DAILY with a meal, Disp: 30 tablet, Rfl: 0 .  nitroGLYCERIN (NITROSTAT) 0.4 MG SL tablet, 1 TABLET UNDER THE TONGUE AS NEEDED FOR CHEST PAIN UP TO 3 TABS, IF NO RESULT CALL 911, Disp: , Rfl:  .  Oxycodone HCl 10 MG TABS, Take 10 mg by mouth every 6 (six) hours as needed (pain)., Disp: , Rfl:  .  pantoprazole (PROTONIX) 40 MG tablet, Take 40 mg by mouth daily. , Disp: , Rfl:  .  rosuvastatin (CRESTOR) 40 MG tablet, Take 40 mg by mouth every evening. , Disp: , Rfl:  .  Tiotropium Bromide Monohydrate (SPIRIVA RESPIMAT) 2.5 MCG/ACT AERS, Inhale 2 puffs into the lungs daily., Disp: , Rfl:  .  tiZANidine (ZANAFLEX) 4 MG tablet, Take 4 mg by mouth at bedtime. , Disp: , Rfl:  .  vitamin B-12 (CYANOCOBALAMIN) 1000 MCG tablet, Take 2,000 mcg by mouth daily. , Disp: , Rfl:  .  ezetimibe (ZETIA) 10 MG tablet, Take 1 tablet (10 mg total) by mouth daily., Disp: 90 tablet, Rfl: 1 .  Misc Natural Products (GLUCOSAMINE CHONDROITIN TRIPLE) TABS, Take 1 tablet by mouth at bedtime., Disp: ,  Rfl:

## 2018-12-26 NOTE — Research (Signed)
Patient seen in followup.  Currently remains on DAPT.  Patient was enrolled in the OPTIMIZE trial with randomization to study stent versus standard stent.   He continues to do well.  Last No major chest pain.    Exam reveals stable gentleman. BP as noted and slightly elevated and rechecked.  Lungs clear. No JVP elevation.  Cardiac exam regular. No significant edema.   ECG unchanged from prior tracing.  NSR.  Old inferior MI.  Continued followup with primary cardiologist Dr. Geraldo Pitter.   Addison Lank, MD Medical Director, [facility]

## 2018-12-30 DIAGNOSIS — J471 Bronchiectasis with (acute) exacerbation: Secondary | ICD-10-CM | POA: Diagnosis not present

## 2019-01-23 DIAGNOSIS — Z9181 History of falling: Secondary | ICD-10-CM | POA: Diagnosis not present

## 2019-01-23 DIAGNOSIS — Z79899 Other long term (current) drug therapy: Secondary | ICD-10-CM | POA: Diagnosis not present

## 2019-01-23 DIAGNOSIS — Z1331 Encounter for screening for depression: Secondary | ICD-10-CM | POA: Diagnosis not present

## 2019-01-23 DIAGNOSIS — Z6828 Body mass index (BMI) 28.0-28.9, adult: Secondary | ICD-10-CM | POA: Diagnosis not present

## 2019-01-23 DIAGNOSIS — M545 Low back pain: Secondary | ICD-10-CM | POA: Diagnosis not present

## 2019-01-23 DIAGNOSIS — I2510 Atherosclerotic heart disease of native coronary artery without angina pectoris: Secondary | ICD-10-CM | POA: Diagnosis not present

## 2019-01-23 DIAGNOSIS — E782 Mixed hyperlipidemia: Secondary | ICD-10-CM | POA: Diagnosis not present

## 2019-01-23 DIAGNOSIS — I10 Essential (primary) hypertension: Secondary | ICD-10-CM | POA: Diagnosis not present

## 2019-01-23 DIAGNOSIS — E118 Type 2 diabetes mellitus with unspecified complications: Secondary | ICD-10-CM | POA: Diagnosis not present

## 2019-01-23 DIAGNOSIS — J449 Chronic obstructive pulmonary disease, unspecified: Secondary | ICD-10-CM | POA: Diagnosis not present

## 2019-03-02 DIAGNOSIS — M545 Low back pain: Secondary | ICD-10-CM | POA: Diagnosis not present

## 2019-03-02 DIAGNOSIS — J301 Allergic rhinitis due to pollen: Secondary | ICD-10-CM | POA: Diagnosis not present

## 2019-03-02 DIAGNOSIS — I10 Essential (primary) hypertension: Secondary | ICD-10-CM | POA: Diagnosis not present

## 2019-03-02 DIAGNOSIS — J449 Chronic obstructive pulmonary disease, unspecified: Secondary | ICD-10-CM | POA: Diagnosis not present

## 2019-03-30 DIAGNOSIS — I2510 Atherosclerotic heart disease of native coronary artery without angina pectoris: Secondary | ICD-10-CM | POA: Diagnosis not present

## 2019-03-30 DIAGNOSIS — M545 Low back pain: Secondary | ICD-10-CM | POA: Diagnosis not present

## 2019-03-30 DIAGNOSIS — E782 Mixed hyperlipidemia: Secondary | ICD-10-CM | POA: Diagnosis not present

## 2019-03-30 DIAGNOSIS — K210 Gastro-esophageal reflux disease with esophagitis: Secondary | ICD-10-CM | POA: Diagnosis not present

## 2019-03-30 DIAGNOSIS — I10 Essential (primary) hypertension: Secondary | ICD-10-CM | POA: Diagnosis not present

## 2019-03-30 DIAGNOSIS — J301 Allergic rhinitis due to pollen: Secondary | ICD-10-CM | POA: Diagnosis not present

## 2019-04-03 DIAGNOSIS — J449 Chronic obstructive pulmonary disease, unspecified: Secondary | ICD-10-CM | POA: Diagnosis not present

## 2019-04-20 DIAGNOSIS — G4733 Obstructive sleep apnea (adult) (pediatric): Secondary | ICD-10-CM | POA: Diagnosis not present

## 2019-04-28 DIAGNOSIS — K210 Gastro-esophageal reflux disease with esophagitis: Secondary | ICD-10-CM | POA: Diagnosis not present

## 2019-04-28 DIAGNOSIS — I2510 Atherosclerotic heart disease of native coronary artery without angina pectoris: Secondary | ICD-10-CM | POA: Diagnosis not present

## 2019-04-28 DIAGNOSIS — E782 Mixed hyperlipidemia: Secondary | ICD-10-CM | POA: Diagnosis not present

## 2019-04-28 DIAGNOSIS — M545 Low back pain: Secondary | ICD-10-CM | POA: Diagnosis not present

## 2019-04-28 DIAGNOSIS — J449 Chronic obstructive pulmonary disease, unspecified: Secondary | ICD-10-CM | POA: Diagnosis not present

## 2019-04-28 DIAGNOSIS — G4730 Sleep apnea, unspecified: Secondary | ICD-10-CM | POA: Diagnosis not present

## 2019-05-03 DIAGNOSIS — J449 Chronic obstructive pulmonary disease, unspecified: Secondary | ICD-10-CM | POA: Diagnosis not present

## 2019-06-02 DIAGNOSIS — I2510 Atherosclerotic heart disease of native coronary artery without angina pectoris: Secondary | ICD-10-CM | POA: Diagnosis not present

## 2019-06-02 DIAGNOSIS — J45909 Unspecified asthma, uncomplicated: Secondary | ICD-10-CM | POA: Diagnosis not present

## 2019-06-02 DIAGNOSIS — M545 Low back pain: Secondary | ICD-10-CM | POA: Diagnosis not present

## 2019-06-02 DIAGNOSIS — E782 Mixed hyperlipidemia: Secondary | ICD-10-CM | POA: Diagnosis not present

## 2019-06-02 DIAGNOSIS — J449 Chronic obstructive pulmonary disease, unspecified: Secondary | ICD-10-CM | POA: Diagnosis not present

## 2019-06-02 DIAGNOSIS — G4730 Sleep apnea, unspecified: Secondary | ICD-10-CM | POA: Diagnosis not present

## 2019-06-02 DIAGNOSIS — K210 Gastro-esophageal reflux disease with esophagitis: Secondary | ICD-10-CM | POA: Diagnosis not present

## 2019-06-03 DIAGNOSIS — J449 Chronic obstructive pulmonary disease, unspecified: Secondary | ICD-10-CM | POA: Diagnosis not present

## 2019-06-11 DIAGNOSIS — G4733 Obstructive sleep apnea (adult) (pediatric): Secondary | ICD-10-CM | POA: Diagnosis not present

## 2019-06-11 DIAGNOSIS — J449 Chronic obstructive pulmonary disease, unspecified: Secondary | ICD-10-CM | POA: Diagnosis not present

## 2019-06-11 DIAGNOSIS — Z9989 Dependence on other enabling machines and devices: Secondary | ICD-10-CM | POA: Diagnosis not present

## 2019-06-11 DIAGNOSIS — Z87891 Personal history of nicotine dependence: Secondary | ICD-10-CM | POA: Diagnosis not present

## 2019-07-02 DIAGNOSIS — M545 Low back pain: Secondary | ICD-10-CM | POA: Diagnosis not present

## 2019-07-02 DIAGNOSIS — I2510 Atherosclerotic heart disease of native coronary artery without angina pectoris: Secondary | ICD-10-CM | POA: Diagnosis not present

## 2019-07-02 DIAGNOSIS — J449 Chronic obstructive pulmonary disease, unspecified: Secondary | ICD-10-CM | POA: Diagnosis not present

## 2019-07-02 DIAGNOSIS — G4730 Sleep apnea, unspecified: Secondary | ICD-10-CM | POA: Diagnosis not present

## 2019-07-02 DIAGNOSIS — E782 Mixed hyperlipidemia: Secondary | ICD-10-CM | POA: Diagnosis not present

## 2019-07-02 DIAGNOSIS — J45909 Unspecified asthma, uncomplicated: Secondary | ICD-10-CM | POA: Diagnosis not present

## 2019-07-02 DIAGNOSIS — R197 Diarrhea, unspecified: Secondary | ICD-10-CM | POA: Diagnosis not present

## 2019-07-02 DIAGNOSIS — K529 Noninfective gastroenteritis and colitis, unspecified: Secondary | ICD-10-CM | POA: Diagnosis not present

## 2019-07-02 DIAGNOSIS — K210 Gastro-esophageal reflux disease with esophagitis: Secondary | ICD-10-CM | POA: Diagnosis not present

## 2019-07-02 DIAGNOSIS — K5792 Diverticulitis of intestine, part unspecified, without perforation or abscess without bleeding: Secondary | ICD-10-CM | POA: Diagnosis not present

## 2019-07-03 DIAGNOSIS — Z7902 Long term (current) use of antithrombotics/antiplatelets: Secondary | ICD-10-CM | POA: Diagnosis not present

## 2019-07-03 DIAGNOSIS — Z7982 Long term (current) use of aspirin: Secondary | ICD-10-CM | POA: Diagnosis not present

## 2019-07-03 DIAGNOSIS — M1990 Unspecified osteoarthritis, unspecified site: Secondary | ICD-10-CM | POA: Diagnosis not present

## 2019-07-03 DIAGNOSIS — A045 Campylobacter enteritis: Secondary | ICD-10-CM | POA: Diagnosis not present

## 2019-07-03 DIAGNOSIS — R069 Unspecified abnormalities of breathing: Secondary | ICD-10-CM | POA: Diagnosis not present

## 2019-07-03 DIAGNOSIS — I10 Essential (primary) hypertension: Secondary | ICD-10-CM | POA: Diagnosis not present

## 2019-07-03 DIAGNOSIS — E876 Hypokalemia: Secondary | ICD-10-CM | POA: Diagnosis not present

## 2019-07-03 DIAGNOSIS — R110 Nausea: Secondary | ICD-10-CM | POA: Diagnosis not present

## 2019-07-03 DIAGNOSIS — K5732 Diverticulitis of large intestine without perforation or abscess without bleeding: Secondary | ICD-10-CM | POA: Diagnosis not present

## 2019-07-03 DIAGNOSIS — R0602 Shortness of breath: Secondary | ICD-10-CM | POA: Diagnosis not present

## 2019-07-03 DIAGNOSIS — R197 Diarrhea, unspecified: Secondary | ICD-10-CM | POA: Diagnosis not present

## 2019-07-03 DIAGNOSIS — K5792 Diverticulitis of intestine, part unspecified, without perforation or abscess without bleeding: Secondary | ICD-10-CM | POA: Diagnosis not present

## 2019-07-03 DIAGNOSIS — I2510 Atherosclerotic heart disease of native coronary artery without angina pectoris: Secondary | ICD-10-CM | POA: Diagnosis not present

## 2019-07-03 DIAGNOSIS — N179 Acute kidney failure, unspecified: Secondary | ICD-10-CM | POA: Diagnosis not present

## 2019-07-03 DIAGNOSIS — J449 Chronic obstructive pulmonary disease, unspecified: Secondary | ICD-10-CM | POA: Diagnosis not present

## 2019-07-03 DIAGNOSIS — Z79899 Other long term (current) drug therapy: Secondary | ICD-10-CM | POA: Diagnosis not present

## 2019-07-09 DIAGNOSIS — Z8719 Personal history of other diseases of the digestive system: Secondary | ICD-10-CM | POA: Diagnosis not present

## 2019-07-09 DIAGNOSIS — K760 Fatty (change of) liver, not elsewhere classified: Secondary | ICD-10-CM | POA: Diagnosis not present

## 2019-07-09 DIAGNOSIS — Z79891 Long term (current) use of opiate analgesic: Secondary | ICD-10-CM | POA: Diagnosis not present

## 2019-07-09 DIAGNOSIS — K5790 Diverticulosis of intestine, part unspecified, without perforation or abscess without bleeding: Secondary | ICD-10-CM | POA: Diagnosis not present

## 2019-07-09 DIAGNOSIS — K5730 Diverticulosis of large intestine without perforation or abscess without bleeding: Secondary | ICD-10-CM | POA: Diagnosis not present

## 2019-07-09 DIAGNOSIS — Z792 Long term (current) use of antibiotics: Secondary | ICD-10-CM | POA: Diagnosis not present

## 2019-07-09 DIAGNOSIS — Z7902 Long term (current) use of antithrombotics/antiplatelets: Secondary | ICD-10-CM | POA: Diagnosis not present

## 2019-07-09 DIAGNOSIS — Z452 Encounter for adjustment and management of vascular access device: Secondary | ICD-10-CM | POA: Diagnosis not present

## 2019-07-09 DIAGNOSIS — Z955 Presence of coronary angioplasty implant and graft: Secondary | ICD-10-CM | POA: Diagnosis not present

## 2019-07-09 DIAGNOSIS — E872 Acidosis: Secondary | ICD-10-CM | POA: Diagnosis not present

## 2019-07-09 DIAGNOSIS — J449 Chronic obstructive pulmonary disease, unspecified: Secondary | ICD-10-CM | POA: Diagnosis not present

## 2019-07-09 DIAGNOSIS — I252 Old myocardial infarction: Secondary | ICD-10-CM | POA: Diagnosis not present

## 2019-07-09 DIAGNOSIS — I808 Phlebitis and thrombophlebitis of other sites: Secondary | ICD-10-CM | POA: Diagnosis not present

## 2019-07-09 DIAGNOSIS — N179 Acute kidney failure, unspecified: Secondary | ICD-10-CM | POA: Diagnosis not present

## 2019-07-09 DIAGNOSIS — R112 Nausea with vomiting, unspecified: Secondary | ICD-10-CM | POA: Diagnosis not present

## 2019-07-09 DIAGNOSIS — R0902 Hypoxemia: Secondary | ICD-10-CM | POA: Diagnosis not present

## 2019-07-09 DIAGNOSIS — J479 Bronchiectasis, uncomplicated: Secondary | ICD-10-CM | POA: Diagnosis not present

## 2019-07-09 DIAGNOSIS — K219 Gastro-esophageal reflux disease without esophagitis: Secondary | ICD-10-CM | POA: Diagnosis not present

## 2019-07-09 DIAGNOSIS — G8929 Other chronic pain: Secondary | ICD-10-CM | POA: Diagnosis not present

## 2019-07-09 DIAGNOSIS — E876 Hypokalemia: Secondary | ICD-10-CM | POA: Diagnosis not present

## 2019-07-09 DIAGNOSIS — Z79899 Other long term (current) drug therapy: Secondary | ICD-10-CM | POA: Diagnosis not present

## 2019-07-09 DIAGNOSIS — I959 Hypotension, unspecified: Secondary | ICD-10-CM | POA: Diagnosis not present

## 2019-07-09 DIAGNOSIS — E860 Dehydration: Secondary | ICD-10-CM | POA: Diagnosis not present

## 2019-07-09 DIAGNOSIS — I2510 Atherosclerotic heart disease of native coronary artery without angina pectoris: Secondary | ICD-10-CM | POA: Diagnosis not present

## 2019-07-09 DIAGNOSIS — R600 Localized edema: Secondary | ICD-10-CM | POA: Diagnosis not present

## 2019-07-09 DIAGNOSIS — R0789 Other chest pain: Secondary | ICD-10-CM | POA: Diagnosis not present

## 2019-07-09 DIAGNOSIS — Z7982 Long term (current) use of aspirin: Secondary | ICD-10-CM | POA: Diagnosis not present

## 2019-07-09 DIAGNOSIS — R404 Transient alteration of awareness: Secondary | ICD-10-CM | POA: Diagnosis not present

## 2019-07-09 DIAGNOSIS — M545 Low back pain: Secondary | ICD-10-CM | POA: Diagnosis not present

## 2019-07-09 DIAGNOSIS — A045 Campylobacter enteritis: Secondary | ICD-10-CM | POA: Diagnosis not present

## 2019-07-09 DIAGNOSIS — I10 Essential (primary) hypertension: Secondary | ICD-10-CM | POA: Diagnosis not present

## 2019-07-09 DIAGNOSIS — R197 Diarrhea, unspecified: Secondary | ICD-10-CM | POA: Diagnosis not present

## 2019-07-09 DIAGNOSIS — M1990 Unspecified osteoarthritis, unspecified site: Secondary | ICD-10-CM | POA: Diagnosis not present

## 2019-07-10 DIAGNOSIS — I959 Hypotension, unspecified: Secondary | ICD-10-CM

## 2019-07-10 DIAGNOSIS — A045 Campylobacter enteritis: Secondary | ICD-10-CM

## 2019-07-10 DIAGNOSIS — R0789 Other chest pain: Secondary | ICD-10-CM

## 2019-07-10 DIAGNOSIS — I10 Essential (primary) hypertension: Secondary | ICD-10-CM

## 2019-07-21 DIAGNOSIS — Z79899 Other long term (current) drug therapy: Secondary | ICD-10-CM | POA: Diagnosis not present

## 2019-07-21 DIAGNOSIS — J449 Chronic obstructive pulmonary disease, unspecified: Secondary | ICD-10-CM | POA: Diagnosis not present

## 2019-07-21 DIAGNOSIS — Z6827 Body mass index (BMI) 27.0-27.9, adult: Secondary | ICD-10-CM | POA: Diagnosis not present

## 2019-07-21 DIAGNOSIS — E782 Mixed hyperlipidemia: Secondary | ICD-10-CM | POA: Diagnosis not present

## 2019-07-21 DIAGNOSIS — K210 Gastro-esophageal reflux disease with esophagitis: Secondary | ICD-10-CM | POA: Diagnosis not present

## 2019-07-21 DIAGNOSIS — G4730 Sleep apnea, unspecified: Secondary | ICD-10-CM | POA: Diagnosis not present

## 2019-07-21 DIAGNOSIS — E663 Overweight: Secondary | ICD-10-CM | POA: Diagnosis not present

## 2019-07-21 DIAGNOSIS — I2510 Atherosclerotic heart disease of native coronary artery without angina pectoris: Secondary | ICD-10-CM | POA: Diagnosis not present

## 2019-07-28 DIAGNOSIS — G4733 Obstructive sleep apnea (adult) (pediatric): Secondary | ICD-10-CM | POA: Diagnosis not present

## 2019-07-29 DIAGNOSIS — A045 Campylobacter enteritis: Secondary | ICD-10-CM | POA: Diagnosis not present

## 2019-07-29 DIAGNOSIS — K5790 Diverticulosis of intestine, part unspecified, without perforation or abscess without bleeding: Secondary | ICD-10-CM | POA: Diagnosis not present

## 2019-07-29 DIAGNOSIS — E860 Dehydration: Secondary | ICD-10-CM | POA: Diagnosis not present

## 2019-07-29 DIAGNOSIS — R197 Diarrhea, unspecified: Secondary | ICD-10-CM | POA: Diagnosis not present

## 2019-08-03 DIAGNOSIS — J449 Chronic obstructive pulmonary disease, unspecified: Secondary | ICD-10-CM | POA: Diagnosis not present

## 2019-08-04 DIAGNOSIS — E559 Vitamin D deficiency, unspecified: Secondary | ICD-10-CM | POA: Diagnosis not present

## 2019-08-04 DIAGNOSIS — E538 Deficiency of other specified B group vitamins: Secondary | ICD-10-CM | POA: Diagnosis not present

## 2019-08-04 DIAGNOSIS — Z79899 Other long term (current) drug therapy: Secondary | ICD-10-CM | POA: Diagnosis not present

## 2019-08-04 DIAGNOSIS — W57XXXA Bitten or stung by nonvenomous insect and other nonvenomous arthropods, initial encounter: Secondary | ICD-10-CM | POA: Diagnosis not present

## 2019-08-04 DIAGNOSIS — Z6827 Body mass index (BMI) 27.0-27.9, adult: Secondary | ICD-10-CM | POA: Diagnosis not present

## 2019-08-04 DIAGNOSIS — E663 Overweight: Secondary | ICD-10-CM | POA: Diagnosis not present

## 2019-08-07 DIAGNOSIS — E538 Deficiency of other specified B group vitamins: Secondary | ICD-10-CM | POA: Diagnosis not present

## 2019-08-24 DIAGNOSIS — A045 Campylobacter enteritis: Secondary | ICD-10-CM | POA: Diagnosis not present

## 2019-08-24 DIAGNOSIS — R197 Diarrhea, unspecified: Secondary | ICD-10-CM | POA: Diagnosis not present

## 2019-08-24 DIAGNOSIS — E860 Dehydration: Secondary | ICD-10-CM | POA: Diagnosis not present

## 2019-08-24 DIAGNOSIS — K5790 Diverticulosis of intestine, part unspecified, without perforation or abscess without bleeding: Secondary | ICD-10-CM | POA: Diagnosis not present

## 2019-09-01 DIAGNOSIS — I10 Essential (primary) hypertension: Secondary | ICD-10-CM | POA: Diagnosis not present

## 2019-09-01 DIAGNOSIS — M546 Pain in thoracic spine: Secondary | ICD-10-CM | POA: Diagnosis not present

## 2019-09-01 DIAGNOSIS — Z6829 Body mass index (BMI) 29.0-29.9, adult: Secondary | ICD-10-CM | POA: Diagnosis not present

## 2019-09-01 DIAGNOSIS — M545 Low back pain: Secondary | ICD-10-CM | POA: Diagnosis not present

## 2019-09-01 DIAGNOSIS — I2510 Atherosclerotic heart disease of native coronary artery without angina pectoris: Secondary | ICD-10-CM | POA: Diagnosis not present

## 2019-09-01 DIAGNOSIS — K210 Gastro-esophageal reflux disease with esophagitis: Secondary | ICD-10-CM | POA: Diagnosis not present

## 2019-09-01 DIAGNOSIS — J449 Chronic obstructive pulmonary disease, unspecified: Secondary | ICD-10-CM | POA: Diagnosis not present

## 2019-09-01 DIAGNOSIS — E663 Overweight: Secondary | ICD-10-CM | POA: Diagnosis not present

## 2019-09-03 DIAGNOSIS — J449 Chronic obstructive pulmonary disease, unspecified: Secondary | ICD-10-CM | POA: Diagnosis not present

## 2019-09-07 DIAGNOSIS — E538 Deficiency of other specified B group vitamins: Secondary | ICD-10-CM | POA: Diagnosis not present

## 2019-10-03 DIAGNOSIS — J449 Chronic obstructive pulmonary disease, unspecified: Secondary | ICD-10-CM | POA: Diagnosis not present

## 2019-10-07 DIAGNOSIS — I10 Essential (primary) hypertension: Secondary | ICD-10-CM | POA: Diagnosis not present

## 2019-10-07 DIAGNOSIS — Z6829 Body mass index (BMI) 29.0-29.9, adult: Secondary | ICD-10-CM | POA: Diagnosis not present

## 2019-10-07 DIAGNOSIS — K2100 Gastro-esophageal reflux disease with esophagitis, without bleeding: Secondary | ICD-10-CM | POA: Diagnosis not present

## 2019-10-07 DIAGNOSIS — J449 Chronic obstructive pulmonary disease, unspecified: Secondary | ICD-10-CM | POA: Diagnosis not present

## 2019-10-07 DIAGNOSIS — I2510 Atherosclerotic heart disease of native coronary artery without angina pectoris: Secondary | ICD-10-CM | POA: Diagnosis not present

## 2019-10-07 DIAGNOSIS — M545 Low back pain: Secondary | ICD-10-CM | POA: Diagnosis not present

## 2019-10-07 DIAGNOSIS — E538 Deficiency of other specified B group vitamins: Secondary | ICD-10-CM | POA: Diagnosis not present

## 2019-10-07 DIAGNOSIS — J189 Pneumonia, unspecified organism: Secondary | ICD-10-CM | POA: Diagnosis not present

## 2019-11-03 DIAGNOSIS — J449 Chronic obstructive pulmonary disease, unspecified: Secondary | ICD-10-CM | POA: Diagnosis not present

## 2019-11-09 DIAGNOSIS — E782 Mixed hyperlipidemia: Secondary | ICD-10-CM | POA: Diagnosis not present

## 2019-11-09 DIAGNOSIS — J449 Chronic obstructive pulmonary disease, unspecified: Secondary | ICD-10-CM | POA: Diagnosis not present

## 2019-11-09 DIAGNOSIS — K2100 Gastro-esophageal reflux disease with esophagitis, without bleeding: Secondary | ICD-10-CM | POA: Diagnosis not present

## 2019-11-09 DIAGNOSIS — M545 Low back pain: Secondary | ICD-10-CM | POA: Diagnosis not present

## 2019-11-09 DIAGNOSIS — I2510 Atherosclerotic heart disease of native coronary artery without angina pectoris: Secondary | ICD-10-CM | POA: Diagnosis not present

## 2019-11-09 DIAGNOSIS — I10 Essential (primary) hypertension: Secondary | ICD-10-CM | POA: Diagnosis not present

## 2019-11-20 DIAGNOSIS — Z006 Encounter for examination for normal comparison and control in clinical research program: Secondary | ICD-10-CM

## 2019-11-20 NOTE — Research (Addendum)
Optimize Research Study  2 Year Follow up   Patient doing well at this time, no adverse events.   Current Outpatient Medications:  .  albuterol (PROVENTIL) (2.5 MG/3ML) 0.083% nebulizer solution, Take 3 mLs by nebulization every 4 (four) hours as needed for shortness of breath, Disp: , Rfl:  .  aspirin EC 81 MG tablet, Take 81 mg by mouth every evening. , Disp: , Rfl:  .  BREO ELLIPTA 100-25 MCG/INH AEPB, Inhale 1 puff into the lungs daily. , Disp: , Rfl:  .  clopidogrel (PLAVIX) 75 MG tablet, Take 75 mg by mouth every evening. , Disp: , Rfl:  .  diphenhydrAMINE (BENADRYL) 25 MG tablet, Take 50 mg by mouth at bedtime as needed for allergies., Disp: , Rfl:  .  ezetimibe (ZETIA) 10 MG tablet, Take 1 tablet (10 mg total) by mouth daily., Disp: 90 tablet, Rfl: 1 .  losartan-hydrochlorothiazide (HYZAAR) 100-12.5 MG tablet, Take 1 tablet by mouth daily., Disp: , Rfl:  .  metoprolol succinate (TOPROL-XL) 100 MG 24 hr tablet, TAKE ONE TABLET BY MOUTH ONCE DAILY with a meal, Disp: 30 tablet, Rfl: 0 .  Misc Natural Products (GLUCOSAMINE CHONDROITIN TRIPLE) TABS, Take 1 tablet by mouth at bedtime., Disp: , Rfl:  .  nitroGLYCERIN (NITROSTAT) 0.4 MG SL tablet, 1 TABLET UNDER THE TONGUE AS NEEDED FOR CHEST PAIN UP TO 3 TABS, IF NO RESULT CALL 911, Disp: , Rfl:  .  Oxycodone HCl 10 MG TABS, Take 10 mg by mouth every 6 (six) hours as needed (pain)., Disp: , Rfl:  .  pantoprazole (PROTONIX) 40 MG tablet, Take 40 mg by mouth daily. , Disp: , Rfl:  .  rosuvastatin (CRESTOR) 40 MG tablet, Take 40 mg by mouth every evening. , Disp: , Rfl:  .  Tiotropium Bromide Monohydrate (SPIRIVA RESPIMAT) 2.5 MCG/ACT AERS, Inhale 2 puffs into the lungs daily., Disp: , Rfl:  .  tiZANidine (ZANAFLEX) 4 MG tablet, Take 4 mg by mouth at bedtime. , Disp: , Rfl:  .  vitamin B-12 (CYANOCOBALAMIN) 1000 MCG tablet, Take 2,000 mcg by mouth daily. , Disp: , Rfl:

## 2019-12-03 DIAGNOSIS — J449 Chronic obstructive pulmonary disease, unspecified: Secondary | ICD-10-CM | POA: Diagnosis not present

## 2019-12-08 DIAGNOSIS — I2510 Atherosclerotic heart disease of native coronary artery without angina pectoris: Secondary | ICD-10-CM | POA: Diagnosis not present

## 2019-12-08 DIAGNOSIS — K2100 Gastro-esophageal reflux disease with esophagitis, without bleeding: Secondary | ICD-10-CM | POA: Diagnosis not present

## 2019-12-08 DIAGNOSIS — E782 Mixed hyperlipidemia: Secondary | ICD-10-CM | POA: Diagnosis not present

## 2019-12-08 DIAGNOSIS — M545 Low back pain: Secondary | ICD-10-CM | POA: Diagnosis not present

## 2019-12-08 DIAGNOSIS — J449 Chronic obstructive pulmonary disease, unspecified: Secondary | ICD-10-CM | POA: Diagnosis not present

## 2019-12-08 DIAGNOSIS — I10 Essential (primary) hypertension: Secondary | ICD-10-CM | POA: Diagnosis not present

## 2019-12-08 DIAGNOSIS — E559 Vitamin D deficiency, unspecified: Secondary | ICD-10-CM | POA: Diagnosis not present

## 2019-12-18 DIAGNOSIS — G4733 Obstructive sleep apnea (adult) (pediatric): Secondary | ICD-10-CM | POA: Diagnosis not present

## 2019-12-18 DIAGNOSIS — Z87891 Personal history of nicotine dependence: Secondary | ICD-10-CM | POA: Diagnosis not present

## 2019-12-18 DIAGNOSIS — Z9989 Dependence on other enabling machines and devices: Secondary | ICD-10-CM | POA: Diagnosis not present

## 2019-12-18 DIAGNOSIS — J449 Chronic obstructive pulmonary disease, unspecified: Secondary | ICD-10-CM | POA: Diagnosis not present

## 2020-01-03 DIAGNOSIS — J449 Chronic obstructive pulmonary disease, unspecified: Secondary | ICD-10-CM | POA: Diagnosis not present

## 2020-01-06 DIAGNOSIS — I10 Essential (primary) hypertension: Secondary | ICD-10-CM | POA: Diagnosis not present

## 2020-01-06 DIAGNOSIS — E118 Type 2 diabetes mellitus with unspecified complications: Secondary | ICD-10-CM | POA: Diagnosis not present

## 2020-01-06 DIAGNOSIS — Z6830 Body mass index (BMI) 30.0-30.9, adult: Secondary | ICD-10-CM | POA: Diagnosis not present

## 2020-01-06 DIAGNOSIS — Z125 Encounter for screening for malignant neoplasm of prostate: Secondary | ICD-10-CM | POA: Diagnosis not present

## 2020-01-06 DIAGNOSIS — M545 Low back pain: Secondary | ICD-10-CM | POA: Diagnosis not present

## 2020-01-06 DIAGNOSIS — E782 Mixed hyperlipidemia: Secondary | ICD-10-CM | POA: Diagnosis not present

## 2020-01-13 DIAGNOSIS — K2270 Barrett's esophagus without dysplasia: Secondary | ICD-10-CM | POA: Diagnosis not present

## 2020-01-13 DIAGNOSIS — R110 Nausea: Secondary | ICD-10-CM | POA: Diagnosis not present

## 2020-02-03 DIAGNOSIS — J449 Chronic obstructive pulmonary disease, unspecified: Secondary | ICD-10-CM | POA: Diagnosis not present

## 2020-02-05 ENCOUNTER — Telehealth: Payer: Self-pay | Admitting: Cardiology

## 2020-02-05 NOTE — Telephone Encounter (Signed)
Wife calls in concerned about husband (DPR on file). States DBP numbers are lower than normal, avg 60-70s.  Pt reporting dizziness and recently HA.  Wife left for a test and came back stating pt didn't look good, he looks pale and doesn't feel well - "he is now feeling dizzy, extremely tired w/ head rushes".  Morning BPs: 146/76, 141/70, 144/73. Advised to take to PCP, urgent care/ED for evaluation.  She mentions new dx of DM and asks if this could be part of the issue.  Informed that abn blood sugars can cause similar symptoms.  Advised to take to local fire dept to have blood sugar checked (they don't have a way to do this at home), then they can head to PCP/urgent care/ED. Aware I will forward to Dr Julien Nordmann nurse to address ASAP appt per wife request. She wants pt to be seen next week. Aware [place] office will be in touch about overdue follow up appt.

## 2020-02-05 NOTE — Telephone Encounter (Signed)
New Message    Pt c/o BP issue: STAT if pt c/o blurred vision, one-sided weakness or slurred speech  1. What are your last 5 BP readings? Between 141-144/70-73   2. Are you having any other symptoms (ex. Dizziness, headache, blurred vision, passed out)? Dizziness  3. What is your BP issue? Yesterday BP was low 107/61, 125/57, 120/60, 127/66 and felt dizziness. Today he has elevated BP. Pts wife says he looks grey/pale in the face.   Pts wife would like pt to be seen today

## 2020-02-05 NOTE — Telephone Encounter (Signed)
Follow up  Pt's wife is following up, she's very concern with the pt she said pt looks very sick and starting to get a headache.

## 2020-02-08 NOTE — Telephone Encounter (Signed)
I agree he should head to the nearest emergency room.

## 2020-02-08 NOTE — Telephone Encounter (Signed)
How do you advise?  Wife calls in concerned about husband (DPR on file). States DBP numbers are lower than normal, avg 60-70s.  Pt reporting dizziness and recently HA.  Wife left for a test and came back stating pt didn't look good, he looks pale and doesn't feel well - "he is now feeling dizzy, extremely tired w/ head rushes".  Morning BPs: 146/76, 141/70, 144/73. Advised to take to PCP, urgent care/ED for evaluation.  She mentions new dx of DM and asks if this could be part of the issue.  Informed that abn blood sugars can cause similar symptoms.  Advised to take to local fire dept to have blood sugar checked (they don't have a way to do this at home), then they can head to PCP/urgent care/ED.

## 2020-02-08 NOTE — Telephone Encounter (Signed)
Called and spoke with Nicholas Love who states that the pt has an appointment on Thursday with Dr. Geraldo Pitter.  She states that the pt's BP has been better yesterday and today. Instructed to go to the nearest ED if needed. Pt also spoke with his PCP.

## 2020-02-09 ENCOUNTER — Ambulatory Visit: Payer: PPO | Admitting: Cardiology

## 2020-02-09 DIAGNOSIS — Z1331 Encounter for screening for depression: Secondary | ICD-10-CM | POA: Diagnosis not present

## 2020-02-09 DIAGNOSIS — E782 Mixed hyperlipidemia: Secondary | ICD-10-CM | POA: Diagnosis not present

## 2020-02-09 DIAGNOSIS — Z9181 History of falling: Secondary | ICD-10-CM | POA: Diagnosis not present

## 2020-02-09 DIAGNOSIS — R42 Dizziness and giddiness: Secondary | ICD-10-CM | POA: Diagnosis not present

## 2020-02-09 DIAGNOSIS — R0602 Shortness of breath: Secondary | ICD-10-CM | POA: Diagnosis not present

## 2020-02-09 DIAGNOSIS — E559 Vitamin D deficiency, unspecified: Secondary | ICD-10-CM | POA: Diagnosis not present

## 2020-02-09 DIAGNOSIS — E869 Volume depletion, unspecified: Secondary | ICD-10-CM | POA: Diagnosis not present

## 2020-02-09 DIAGNOSIS — Z139 Encounter for screening, unspecified: Secondary | ICD-10-CM | POA: Diagnosis not present

## 2020-02-09 DIAGNOSIS — K2100 Gastro-esophageal reflux disease with esophagitis, without bleeding: Secondary | ICD-10-CM | POA: Diagnosis not present

## 2020-02-09 DIAGNOSIS — J449 Chronic obstructive pulmonary disease, unspecified: Secondary | ICD-10-CM | POA: Diagnosis not present

## 2020-02-09 DIAGNOSIS — M545 Low back pain: Secondary | ICD-10-CM | POA: Diagnosis not present

## 2020-02-09 DIAGNOSIS — I10 Essential (primary) hypertension: Secondary | ICD-10-CM | POA: Diagnosis not present

## 2020-02-11 ENCOUNTER — Ambulatory Visit (INDEPENDENT_AMBULATORY_CARE_PROVIDER_SITE_OTHER): Payer: PPO | Admitting: Cardiology

## 2020-02-11 ENCOUNTER — Encounter: Payer: Self-pay | Admitting: Cardiology

## 2020-02-11 ENCOUNTER — Other Ambulatory Visit: Payer: Self-pay

## 2020-02-11 DIAGNOSIS — E782 Mixed hyperlipidemia: Secondary | ICD-10-CM

## 2020-02-11 DIAGNOSIS — I2510 Atherosclerotic heart disease of native coronary artery without angina pectoris: Secondary | ICD-10-CM

## 2020-02-11 DIAGNOSIS — I10 Essential (primary) hypertension: Secondary | ICD-10-CM

## 2020-02-11 NOTE — Patient Instructions (Signed)
Medication Instructions:  No medication changes *If you need a refill on your cardiac medications before your next appointment, please call your pharmacy*   Lab Work: None ordered If you have labs (blood work) drawn today and your tests are completely normal, you will receive your results only by: . MyChart Message (if you have MyChart) OR . A paper copy in the mail If you have any lab test that is abnormal or we need to change your treatment, we will call you to review the results.   Testing/Procedures: Your physician has requested that you have a lexiscan myoview. For further information please visit [email or web address]. Please follow instruction sheet, as given.    Follow-Up: At [facility], you and your health needs are our priority.  As part of our continuing mission to provide you with exceptional heart care, we have created designated Provider Care Teams.  These Care Teams include your primary Cardiologist (physician) and Advanced Practice Providers (APPs -  Physician Assistants and Nurse Practitioners) who all work together to provide you with the care you need, when you need it.  We recommend signing up for the patient portal called "MyChart".  Sign up information is provided on this After Visit Summary.  MyChart is used to connect with patients for Virtual Visits (Telemedicine).  Patients are able to view lab/test results, encounter notes, upcoming appointments, etc.  Non-urgent messages can be sent to your provider as well.   To learn more about what you can do with MyChart, go to [email or web address].    Your next appointment:   3 month(s)  The format for your next appointment:   In Person  Provider:   Rajan Revankar, MD   Other Instructions NA 

## 2020-02-11 NOTE — Progress Notes (Signed)
Cardiology Office Note:    Date:  02/11/2020   ID:  Nicholas Love, DOB 03-17-1956, MRN [id number]  PCP:  Ocie Doyne., MD (Inactive)  Cardiologist:  Jenean Lindau, MD   Referring MD: No ref. provider found    ASSESSMENT:    1. Coronary artery disease involving native coronary artery of native heart without angina pectoris   2. Essential hypertension   3. Mixed dyslipidemia    PLAN:    In order of problems listed above:  1. EKG reveals sinus rhythm and nonspecific ST-T changes 2. Coronary artery disease: Secondary prevention stressed with the patient.  Importance of compliance with diet and medication stressed and he vocalized understanding.  Importance of ambulation and exercise stressed to he has challenges because of his orthopedic issues mentioned below.  In view of the above we will get a Lexiscan sestamibi.   3. Essential hypertension: Blood pressure is elevated but he has an element of whitecoat hypertension.  His blood pressure is fine at home according to the numbers he has told me.  Also some of his symptoms suggest postural hypotension so I believe his blood pressure medication where it is now and told him to monitor it twice a day 4. Mixed dyslipidemia: He has his lipids followed by primary care physician and were checked recently.  I told him to watch his diet carefully and reduce weight risks of obesity explained he vocalized understanding 5. Patient will be seen in follow-up appointment in 3 months or earlier if the patient has any concerns    Medication Adjustments/Labs and Tests Ordered: Current medicines are reviewed at length with the patient today.  Concerns regarding medicines are outlined above.  No orders of the defined types were placed in this encounter.  No orders of the defined types were placed in this encounter.    Chief Complaint  Patient presents with  . Blood Pressure Issues     History of Present Illness:    Nicholas Love is a 64 y.o.  male.  Patient has past medical history of coronary artery disease post stenting and the details are mentioned below, essential hypertension dyslipidemia.  He denies any problems at this time and takes care of activities of daily living.  He had an episode of dizziness and went to the hospital.  He was found to have some dehydration and his blood pressure medication was reduced and he feels much better now.  No chest pain orthopnea or PND.  He leads a sedentary lifestyle.  He has been not very compliant with office follow-ups.  He has barely come to the office after his stenting in 2019.  He is overweight.  At the time of my evaluation, the patient is alert awake oriented and in no distress.  Past Medical History:  Diagnosis Date  . Angina pectoris ([place])   . Arthritis   . Asthma   . Barrett esophagus   . Bronchiectasis ([place])   . CAD (coronary artery disease)    a. prior mild RCAx2 and LAD stenting. b. same day PCI DES to dRCA 12/2017, 100% occ oD1 with L-L collaterals, otherwise nonobstructive disease, EF normal.  . COPD (chronic obstructive pulmonary disease) ([place])   . Essential hypertension 01/17/2018  . GERD (gastroesophageal reflux disease)   . Hiatal hernia   . Hyperlipemia   . Hypertension   . Mixed dyslipidemia 01/17/2018  . Myocardial infarct ([place])   . Sleep apnea   . SOB (shortness of breath)  Past Surgical History:  Procedure Laterality Date  . arthroscopic knee    . BACK SURGERY     x3 lower back  . CARDIAC CATHETERIZATION     3 stents  . CORONARY STENT INTERVENTION N/A 12/20/2017   Procedure: CORONARY STENT INTERVENTION;  Surgeon: Martinique, Peter M, MD;  Location: [facility];  Service: Cardiovascular;  Laterality: N/A;  . FRACTURE SURGERY  1970's   ankle  . LEFT HEART CATH AND CORONARY ANGIOGRAPHY N/A 12/20/2017   Procedure: LEFT HEART CATH AND CORONARY ANGIOGRAPHY;  Surgeon: Martinique, Peter M, MD;  Location: [facility];  Service: Cardiovascular;  Laterality:  N/A;    Current Medications: Current Meds  Medication Sig  . albuterol (PROVENTIL) (2.5 MG/3ML) 0.083% nebulizer solution Take 3 mLs by nebulization every 4 (four) hours as needed for shortness of breath  . aspirin EC 81 MG tablet Take 81 mg by mouth every evening.   Marland Kitchen BREO ELLIPTA 100-25 MCG/INH AEPB Inhale 1 puff into the lungs daily.   . clopidogrel (PLAVIX) 75 MG tablet Take 75 mg by mouth every evening.   . dicyclomine (BENTYL) 10 MG capsule Take 10 mg by mouth every 6 (six) hours as needed.  . metoprolol succinate (TOPROL-XL) 100 MG 24 hr tablet TAKE ONE TABLET BY MOUTH ONCE DAILY with a meal  . montelukast (SINGULAIR) 10 MG tablet Take 10 mg by mouth at bedtime.  . nitroGLYCERIN (NITROSTAT) 0.4 MG SL tablet 1 TABLET UNDER THE TONGUE AS NEEDED FOR CHEST PAIN UP TO 3 TABS, IF NO RESULT CALL 911  . Oxycodone HCl 10 MG TABS Take 10 mg by mouth every 6 (six) hours as needed (pain).  . pantoprazole (PROTONIX) 40 MG tablet Take 40 mg by mouth daily.   . promethazine (PHENERGAN) 25 MG tablet Take 25 mg by mouth every 8 (eight) hours as needed.  . rosuvastatin (CRESTOR) 40 MG tablet Take 40 mg by mouth every evening.   Marland Kitchen SODIUM BICARBONATE PO Take by mouth.  . Tiotropium Bromide Monohydrate (SPIRIVA RESPIMAT) 2.5 MCG/ACT AERS Inhale 2 puffs into the lungs daily.  Marland Kitchen tiZANidine (ZANAFLEX) 4 MG tablet Take 4 mg by mouth at bedtime.   . vitamin B-12 (CYANOCOBALAMIN) 1000 MCG tablet Take 2,000 mcg by mouth daily.   . Vitamin D, Ergocalciferol, (DRISDOL) 1.25 MG (50000 UNIT) CAPS capsule Take 50,000 Units by mouth once a week.     Allergies:   Ceftriaxone, Cefdinir, and Ciprofloxacin   Social History   Socioeconomic History  . Marital status: Married    Spouse name: Not on file  . Number of children: Not on file  . Years of education: Not on file  . Highest education level: Not on file  Occupational History  . Not on file  Tobacco Use  . Smoking status: Never Smoker  . Smokeless  tobacco: Never Used  Substance and Sexual Activity  . Alcohol use: Not on file  . Drug use: Not on file  . Sexual activity: Not on file  Other Topics Concern  . Not on file  Social History Narrative  . Not on file   Social Determinants of Health   Financial Resource Strain:   . Difficulty of Paying Living Expenses: Not on file  Food Insecurity:   . Worried About Charity fundraiser in the Last Year: Not on file  . Ran Out of Food in the Last Year: Not on file  Transportation Needs:   . Lack of Transportation (Medical): Not  on file  . Lack of Transportation (Non-Medical): Not on file  Physical Activity:   . Days of Exercise per Week: Not on file  . Minutes of Exercise per Session: Not on file  Stress:   . Feeling of Stress : Not on file  Social Connections:   . Frequency of Communication with Friends and Family: Not on file  . Frequency of Social Gatherings with Friends and Family: Not on file  . Attends Religious Services: Not on file  . Active Member of Clubs or Organizations: Not on file  . Attends Archivist Meetings: Not on file  . Marital Status: Not on file     Family History: The patient's family history is not on file.  ROS:   Please see the history of present illness.    All other systems reviewed and are negative.  EKGs/Labs/Other Studies Reviewed:    The following studies were reviewed today: Martinique, Peter M, MD Study date: 12/20/17  MyChart Results Release  MyChart Status: Code Exp Results Release  Physicians  Panel Physicians Referring Physician Case Authorizing Physician  Martinique, Peter M, MD (Primary)    Procedures  CORONARY STENT INTERVENTION  LEFT HEART CATH AND CORONARY ANGIOGRAPHY  Conclusion    Dist RCA lesion is 75% stenosed.  Mid RCA lesion is 20% stenosed.  Prox RCA to Mid RCA lesion is 20% stenosed.  Ost 1st Diag lesion is 100% stenosed.  Prox LAD-1 lesion is 30% stenosed.  Previously placed Prox LAD-2 stent  (unknown type) is widely patent.  Prox LAD to Mid LAD lesion is 30% stenosed.  The left ventricular systolic function is normal.  LV end diastolic pressure is normal.  The left ventricular ejection fraction is 55-65% by visual estimate.  Post intervention, there is a 0% residual stenosis.  A drug-eluting stent was successfully placed using a STENT SIERRA 4.00 X 23 MM.   1. 2 vessel obstructive CAD.    - 100% first diagonal with left to left collaterals.    - 75% distal RCA with abnormal FFR of 0.65 2. Continued patency of stents in the mid RCA x 2 and mid LAD 3. Normal LV function 4. Normal LVEDP 5. Successful stenting of the distal RCA with DES.   Plan: continue DAPT for at least one year. Anticipate same day discharge if stable.        Recent Labs: No results found for requested labs within last 8760 hours.  Recent Lipid Panel    Component Value Date/Time   CHOL 184 01/17/2018 0923   TRIG 123 01/17/2018 0923   HDL 42 01/17/2018 0923   CHOLHDL 4.4 01/17/2018 0923   LDLCALC 117 (H) 01/17/2018 0923    Physical Exam:    VS:  BP (!) 160/90   Pulse 77   Ht 6\' 1"  (1.854 m)   Wt 232 lb (105.2 kg)   SpO2 95%   BMI 30.61 kg/m     Wt Readings from Last 3 Encounters:  02/11/20 232 lb (105.2 kg)  01/17/18 214 lb (97.1 kg)  12/25/17 220 lb (99.8 kg)     GEN: Patient is in no acute distress HEENT: Normal NECK: No JVD; No carotid bruits LYMPHATICS: No lymphadenopathy CARDIAC: Hear sounds regular, 2/6 systolic murmur at the apex. RESPIRATORY:  Clear to auscultation without rales, wheezing or rhonchi  ABDOMEN: Soft, non-tender, non-distended MUSCULOSKELETAL:  No edema; No deformity  SKIN: Warm and dry NEUROLOGIC:  Alert and oriented x 3 PSYCHIATRIC:  Normal affect  Signed, Jenean Lindau, MD  02/11/2020 8:20 AM    [place]

## 2020-02-25 DIAGNOSIS — J449 Chronic obstructive pulmonary disease, unspecified: Secondary | ICD-10-CM | POA: Diagnosis not present

## 2020-02-25 DIAGNOSIS — Q324 Other congenital malformations of bronchus: Secondary | ICD-10-CM | POA: Diagnosis not present

## 2020-02-25 DIAGNOSIS — I10 Essential (primary) hypertension: Secondary | ICD-10-CM | POA: Diagnosis not present

## 2020-02-25 DIAGNOSIS — M545 Low back pain: Secondary | ICD-10-CM | POA: Diagnosis not present

## 2020-02-25 DIAGNOSIS — I2510 Atherosclerotic heart disease of native coronary artery without angina pectoris: Secondary | ICD-10-CM | POA: Diagnosis not present

## 2020-03-02 DIAGNOSIS — J449 Chronic obstructive pulmonary disease, unspecified: Secondary | ICD-10-CM | POA: Diagnosis not present

## 2020-03-10 DIAGNOSIS — J449 Chronic obstructive pulmonary disease, unspecified: Secondary | ICD-10-CM | POA: Diagnosis not present

## 2020-03-10 DIAGNOSIS — I2510 Atherosclerotic heart disease of native coronary artery without angina pectoris: Secondary | ICD-10-CM | POA: Diagnosis not present

## 2020-03-10 DIAGNOSIS — E782 Mixed hyperlipidemia: Secondary | ICD-10-CM | POA: Diagnosis not present

## 2020-03-10 DIAGNOSIS — K2100 Gastro-esophageal reflux disease with esophagitis, without bleeding: Secondary | ICD-10-CM | POA: Diagnosis not present

## 2020-03-10 DIAGNOSIS — J301 Allergic rhinitis due to pollen: Secondary | ICD-10-CM | POA: Diagnosis not present

## 2020-03-10 DIAGNOSIS — I10 Essential (primary) hypertension: Secondary | ICD-10-CM | POA: Diagnosis not present

## 2020-03-10 DIAGNOSIS — M545 Low back pain: Secondary | ICD-10-CM | POA: Diagnosis not present

## 2020-03-22 DIAGNOSIS — J020 Streptococcal pharyngitis: Secondary | ICD-10-CM | POA: Diagnosis not present

## 2020-03-22 DIAGNOSIS — B349 Viral infection, unspecified: Secondary | ICD-10-CM | POA: Diagnosis not present

## 2020-03-22 DIAGNOSIS — H1010 Acute atopic conjunctivitis, unspecified eye: Secondary | ICD-10-CM | POA: Diagnosis not present

## 2020-03-23 DIAGNOSIS — J449 Chronic obstructive pulmonary disease, unspecified: Secondary | ICD-10-CM | POA: Diagnosis not present

## 2020-04-02 DIAGNOSIS — J449 Chronic obstructive pulmonary disease, unspecified: Secondary | ICD-10-CM | POA: Diagnosis not present

## 2020-04-05 DIAGNOSIS — M545 Low back pain: Secondary | ICD-10-CM | POA: Diagnosis not present

## 2020-04-05 DIAGNOSIS — K2100 Gastro-esophageal reflux disease with esophagitis, without bleeding: Secondary | ICD-10-CM | POA: Diagnosis not present

## 2020-04-05 DIAGNOSIS — E782 Mixed hyperlipidemia: Secondary | ICD-10-CM | POA: Diagnosis not present

## 2020-04-05 DIAGNOSIS — J301 Allergic rhinitis due to pollen: Secondary | ICD-10-CM | POA: Diagnosis not present

## 2020-04-05 DIAGNOSIS — Z79899 Other long term (current) drug therapy: Secondary | ICD-10-CM | POA: Diagnosis not present

## 2020-04-05 DIAGNOSIS — I10 Essential (primary) hypertension: Secondary | ICD-10-CM | POA: Diagnosis not present

## 2020-04-05 DIAGNOSIS — J449 Chronic obstructive pulmonary disease, unspecified: Secondary | ICD-10-CM | POA: Diagnosis not present

## 2020-04-05 DIAGNOSIS — I2510 Atherosclerotic heart disease of native coronary artery without angina pectoris: Secondary | ICD-10-CM | POA: Diagnosis not present

## 2020-04-05 DIAGNOSIS — E118 Type 2 diabetes mellitus with unspecified complications: Secondary | ICD-10-CM | POA: Diagnosis not present

## 2020-04-11 DIAGNOSIS — E782 Mixed hyperlipidemia: Secondary | ICD-10-CM | POA: Diagnosis not present

## 2020-04-11 DIAGNOSIS — E118 Type 2 diabetes mellitus with unspecified complications: Secondary | ICD-10-CM | POA: Diagnosis not present

## 2020-04-11 DIAGNOSIS — Z79899 Other long term (current) drug therapy: Secondary | ICD-10-CM | POA: Diagnosis not present

## 2020-05-05 DIAGNOSIS — E118 Type 2 diabetes mellitus with unspecified complications: Secondary | ICD-10-CM | POA: Diagnosis not present

## 2020-05-05 DIAGNOSIS — Z6830 Body mass index (BMI) 30.0-30.9, adult: Secondary | ICD-10-CM | POA: Diagnosis not present

## 2020-05-05 DIAGNOSIS — E538 Deficiency of other specified B group vitamins: Secondary | ICD-10-CM | POA: Diagnosis not present

## 2020-05-05 DIAGNOSIS — M545 Low back pain: Secondary | ICD-10-CM | POA: Diagnosis not present

## 2020-05-05 DIAGNOSIS — Z79899 Other long term (current) drug therapy: Secondary | ICD-10-CM | POA: Diagnosis not present

## 2020-05-05 DIAGNOSIS — M549 Dorsalgia, unspecified: Secondary | ICD-10-CM | POA: Diagnosis not present

## 2020-05-05 DIAGNOSIS — I2510 Atherosclerotic heart disease of native coronary artery without angina pectoris: Secondary | ICD-10-CM | POA: Diagnosis not present

## 2020-05-05 DIAGNOSIS — I10 Essential (primary) hypertension: Secondary | ICD-10-CM | POA: Diagnosis not present

## 2020-05-16 ENCOUNTER — Ambulatory Visit: Payer: PPO | Admitting: Cardiology

## 2020-05-19 DIAGNOSIS — H2513 Age-related nuclear cataract, bilateral: Secondary | ICD-10-CM | POA: Diagnosis not present

## 2020-05-19 DIAGNOSIS — H43391 Other vitreous opacities, right eye: Secondary | ICD-10-CM | POA: Diagnosis not present

## 2020-05-19 DIAGNOSIS — Z7984 Long term (current) use of oral hypoglycemic drugs: Secondary | ICD-10-CM | POA: Diagnosis not present

## 2020-05-19 DIAGNOSIS — H524 Presbyopia: Secondary | ICD-10-CM | POA: Diagnosis not present

## 2020-05-19 DIAGNOSIS — E119 Type 2 diabetes mellitus without complications: Secondary | ICD-10-CM | POA: Diagnosis not present

## 2020-05-19 DIAGNOSIS — H16223 Keratoconjunctivitis sicca, not specified as Sjogren's, bilateral: Secondary | ICD-10-CM | POA: Diagnosis not present

## 2020-05-19 DIAGNOSIS — H18413 Arcus senilis, bilateral: Secondary | ICD-10-CM | POA: Diagnosis not present

## 2020-05-19 DIAGNOSIS — H02054 Trichiasis without entropian left upper eyelid: Secondary | ICD-10-CM | POA: Diagnosis not present

## 2020-06-06 DIAGNOSIS — I2510 Atherosclerotic heart disease of native coronary artery without angina pectoris: Secondary | ICD-10-CM | POA: Diagnosis not present

## 2020-06-06 DIAGNOSIS — E782 Mixed hyperlipidemia: Secondary | ICD-10-CM | POA: Diagnosis not present

## 2020-06-06 DIAGNOSIS — Z6830 Body mass index (BMI) 30.0-30.9, adult: Secondary | ICD-10-CM | POA: Diagnosis not present

## 2020-06-06 DIAGNOSIS — I10 Essential (primary) hypertension: Secondary | ICD-10-CM | POA: Diagnosis not present

## 2020-06-06 DIAGNOSIS — J449 Chronic obstructive pulmonary disease, unspecified: Secondary | ICD-10-CM | POA: Diagnosis not present

## 2020-06-06 DIAGNOSIS — M545 Low back pain: Secondary | ICD-10-CM | POA: Diagnosis not present

## 2020-06-06 DIAGNOSIS — J069 Acute upper respiratory infection, unspecified: Secondary | ICD-10-CM | POA: Diagnosis not present

## 2020-07-06 DIAGNOSIS — M545 Low back pain: Secondary | ICD-10-CM | POA: Diagnosis not present

## 2020-07-06 DIAGNOSIS — I2510 Atherosclerotic heart disease of native coronary artery without angina pectoris: Secondary | ICD-10-CM | POA: Diagnosis not present

## 2020-07-06 DIAGNOSIS — I10 Essential (primary) hypertension: Secondary | ICD-10-CM | POA: Diagnosis not present

## 2020-07-06 DIAGNOSIS — E782 Mixed hyperlipidemia: Secondary | ICD-10-CM | POA: Diagnosis not present

## 2020-07-26 DIAGNOSIS — E538 Deficiency of other specified B group vitamins: Secondary | ICD-10-CM | POA: Diagnosis not present

## 2020-07-26 DIAGNOSIS — E559 Vitamin D deficiency, unspecified: Secondary | ICD-10-CM | POA: Diagnosis not present

## 2020-07-26 DIAGNOSIS — M545 Low back pain: Secondary | ICD-10-CM | POA: Diagnosis not present

## 2020-07-26 DIAGNOSIS — J189 Pneumonia, unspecified organism: Secondary | ICD-10-CM | POA: Diagnosis not present

## 2020-07-26 DIAGNOSIS — E118 Type 2 diabetes mellitus with unspecified complications: Secondary | ICD-10-CM | POA: Diagnosis not present

## 2020-07-26 DIAGNOSIS — J449 Chronic obstructive pulmonary disease, unspecified: Secondary | ICD-10-CM | POA: Diagnosis not present

## 2020-07-26 DIAGNOSIS — J301 Allergic rhinitis due to pollen: Secondary | ICD-10-CM | POA: Diagnosis not present

## 2020-07-26 DIAGNOSIS — E291 Testicular hypofunction: Secondary | ICD-10-CM | POA: Diagnosis not present

## 2020-07-26 DIAGNOSIS — I10 Essential (primary) hypertension: Secondary | ICD-10-CM | POA: Diagnosis not present

## 2020-07-26 DIAGNOSIS — E782 Mixed hyperlipidemia: Secondary | ICD-10-CM | POA: Diagnosis not present

## 2020-08-03 DIAGNOSIS — Z6829 Body mass index (BMI) 29.0-29.9, adult: Secondary | ICD-10-CM | POA: Diagnosis not present

## 2020-08-03 DIAGNOSIS — I10 Essential (primary) hypertension: Secondary | ICD-10-CM | POA: Diagnosis not present

## 2020-08-03 DIAGNOSIS — M545 Low back pain: Secondary | ICD-10-CM | POA: Diagnosis not present

## 2020-09-05 DIAGNOSIS — M545 Low back pain: Secondary | ICD-10-CM | POA: Diagnosis not present

## 2020-09-05 DIAGNOSIS — Z6830 Body mass index (BMI) 30.0-30.9, adult: Secondary | ICD-10-CM | POA: Diagnosis not present

## 2020-10-05 DIAGNOSIS — Z6830 Body mass index (BMI) 30.0-30.9, adult: Secondary | ICD-10-CM | POA: Diagnosis not present

## 2020-10-05 DIAGNOSIS — I10 Essential (primary) hypertension: Secondary | ICD-10-CM | POA: Diagnosis not present

## 2020-10-05 DIAGNOSIS — M545 Low back pain, unspecified: Secondary | ICD-10-CM | POA: Diagnosis not present

## 2020-10-05 DIAGNOSIS — G4700 Insomnia, unspecified: Secondary | ICD-10-CM | POA: Diagnosis not present

## 2020-11-02 DIAGNOSIS — I10 Essential (primary) hypertension: Secondary | ICD-10-CM | POA: Diagnosis not present

## 2020-11-02 DIAGNOSIS — Z6830 Body mass index (BMI) 30.0-30.9, adult: Secondary | ICD-10-CM | POA: Diagnosis not present

## 2020-11-02 DIAGNOSIS — M545 Low back pain, unspecified: Secondary | ICD-10-CM | POA: Diagnosis not present

## 2020-11-30 DIAGNOSIS — Z006 Encounter for examination for normal comparison and control in clinical research program: Secondary | ICD-10-CM

## 2020-11-30 NOTE — Research (Signed)
Optimize Research Study  3 Year Follow up  Patient doing well at this time, no medication changes or adverse events to report.   Current Outpatient Medications:  .  albuterol (PROVENTIL) (2.5 MG/3ML) 0.083% nebulizer solution, Take 3 mLs by nebulization every 4 (four) hours as needed for shortness of breath, Disp: , Rfl:  .  amLODipine (NORVASC) 10 MG tablet, Take 10 mg by mouth daily., Disp: , Rfl:  .  aspirin EC 81 MG tablet, Take 81 mg by mouth every evening. , Disp: , Rfl:  .  BREO ELLIPTA 100-25 MCG/INH AEPB, Inhale 1 puff into the lungs daily. , Disp: , Rfl:  .  clopidogrel (PLAVIX) 75 MG tablet, Take 75 mg by mouth every evening. , Disp: , Rfl:  .  dicyclomine (BENTYL) 10 MG capsule, Take 10 mg by mouth every 6 (six) hours as needed., Disp: , Rfl:  .  losartan-hydrochlorothiazide (HYZAAR) 100-12.5 MG tablet, Take 1 tablet by mouth daily., Disp: , Rfl:  .  metoprolol succinate (TOPROL-XL) 100 MG 24 hr tablet, TAKE ONE TABLET BY MOUTH ONCE DAILY with a meal, Disp: 30 tablet, Rfl: 0 .  montelukast (SINGULAIR) 10 MG tablet, Take 10 mg by mouth at bedtime., Disp: , Rfl:  .  nitroGLYCERIN (NITROSTAT) 0.4 MG SL tablet, 1 TABLET UNDER THE TONGUE AS NEEDED FOR CHEST PAIN UP TO 3 TABS, IF NO RESULT CALL 911, Disp: , Rfl:  .  Oxycodone HCl 10 MG TABS, Take 10 mg by mouth every 6 (six) hours as needed (pain)., Disp: , Rfl:  .  pantoprazole (PROTONIX) 40 MG tablet, Take 40 mg by mouth daily. , Disp: , Rfl:  .  promethazine (PHENERGAN) 25 MG tablet, Take 25 mg by mouth every 8 (eight) hours as needed., Disp: , Rfl:  .  rosuvastatin (CRESTOR) 40 MG tablet, Take 40 mg by mouth every evening. , Disp: , Rfl:  .  SODIUM BICARBONATE PO, Take by mouth., Disp: , Rfl:  .  Tiotropium Bromide Monohydrate 2.5 MCG/ACT AERS, Inhale 2 puffs into the lungs daily., Disp: , Rfl:  .  tiZANidine (ZANAFLEX) 4 MG tablet, Take 4 mg by mouth at bedtime. , Disp: , Rfl:  .  vitamin B-12 (CYANOCOBALAMIN) 1000 MCG tablet,  Take 2,000 mcg by mouth daily. , Disp: , Rfl:  .  Vitamin D, Ergocalciferol, (DRISDOL) 1.25 MG (50000 UNIT) CAPS capsule, Take 50,000 Units by mouth once a week., Disp: , Rfl:  .  ezetimibe (ZETIA) 10 MG tablet, Take 1 tablet (10 mg total) by mouth daily., Disp: 90 tablet, Rfl: 1

## 2020-12-01 DIAGNOSIS — I10 Essential (primary) hypertension: Secondary | ICD-10-CM | POA: Diagnosis not present

## 2020-12-01 DIAGNOSIS — J449 Chronic obstructive pulmonary disease, unspecified: Secondary | ICD-10-CM | POA: Diagnosis not present

## 2020-12-01 DIAGNOSIS — M545 Low back pain, unspecified: Secondary | ICD-10-CM | POA: Diagnosis not present

## 2020-12-01 DIAGNOSIS — Z6830 Body mass index (BMI) 30.0-30.9, adult: Secondary | ICD-10-CM | POA: Diagnosis not present

## 2020-12-01 DIAGNOSIS — J301 Allergic rhinitis due to pollen: Secondary | ICD-10-CM | POA: Diagnosis not present

## 2020-12-29 DIAGNOSIS — M546 Pain in thoracic spine: Secondary | ICD-10-CM | POA: Diagnosis not present

## 2020-12-29 DIAGNOSIS — I10 Essential (primary) hypertension: Secondary | ICD-10-CM | POA: Diagnosis not present

## 2021-01-30 DIAGNOSIS — M25561 Pain in right knee: Secondary | ICD-10-CM | POA: Diagnosis not present

## 2021-01-30 DIAGNOSIS — M1711 Unilateral primary osteoarthritis, right knee: Secondary | ICD-10-CM | POA: Diagnosis not present

## 2021-01-31 DIAGNOSIS — Z125 Encounter for screening for malignant neoplasm of prostate: Secondary | ICD-10-CM | POA: Diagnosis not present

## 2021-01-31 DIAGNOSIS — Z6830 Body mass index (BMI) 30.0-30.9, adult: Secondary | ICD-10-CM | POA: Diagnosis not present

## 2021-01-31 DIAGNOSIS — J449 Chronic obstructive pulmonary disease, unspecified: Secondary | ICD-10-CM | POA: Diagnosis not present

## 2021-01-31 DIAGNOSIS — I10 Essential (primary) hypertension: Secondary | ICD-10-CM | POA: Diagnosis not present

## 2021-01-31 DIAGNOSIS — E782 Mixed hyperlipidemia: Secondary | ICD-10-CM | POA: Diagnosis not present

## 2021-01-31 DIAGNOSIS — M25561 Pain in right knee: Secondary | ICD-10-CM | POA: Diagnosis not present

## 2021-01-31 DIAGNOSIS — E559 Vitamin D deficiency, unspecified: Secondary | ICD-10-CM | POA: Diagnosis not present

## 2021-01-31 DIAGNOSIS — E0590 Thyrotoxicosis, unspecified without thyrotoxic crisis or storm: Secondary | ICD-10-CM | POA: Diagnosis not present

## 2021-01-31 DIAGNOSIS — E118 Type 2 diabetes mellitus with unspecified complications: Secondary | ICD-10-CM | POA: Diagnosis not present

## 2021-01-31 DIAGNOSIS — E538 Deficiency of other specified B group vitamins: Secondary | ICD-10-CM | POA: Diagnosis not present

## 2021-01-31 DIAGNOSIS — Z79899 Other long term (current) drug therapy: Secondary | ICD-10-CM | POA: Diagnosis not present

## 2021-01-31 DIAGNOSIS — G4700 Insomnia, unspecified: Secondary | ICD-10-CM | POA: Diagnosis not present

## 2021-01-31 DIAGNOSIS — M546 Pain in thoracic spine: Secondary | ICD-10-CM | POA: Diagnosis not present

## 2021-02-27 DIAGNOSIS — M1711 Unilateral primary osteoarthritis, right knee: Secondary | ICD-10-CM | POA: Diagnosis not present

## 2021-03-01 DIAGNOSIS — E118 Type 2 diabetes mellitus with unspecified complications: Secondary | ICD-10-CM | POA: Diagnosis not present

## 2021-03-01 DIAGNOSIS — M546 Pain in thoracic spine: Secondary | ICD-10-CM | POA: Diagnosis not present

## 2021-03-28 DIAGNOSIS — E119 Type 2 diabetes mellitus without complications: Secondary | ICD-10-CM | POA: Diagnosis not present

## 2021-03-28 DIAGNOSIS — J479 Bronchiectasis, uncomplicated: Secondary | ICD-10-CM | POA: Diagnosis not present

## 2021-03-28 DIAGNOSIS — G4733 Obstructive sleep apnea (adult) (pediatric): Secondary | ICD-10-CM | POA: Diagnosis not present

## 2021-03-28 DIAGNOSIS — J9610 Chronic respiratory failure, unspecified whether with hypoxia or hypercapnia: Secondary | ICD-10-CM | POA: Diagnosis not present

## 2021-03-28 DIAGNOSIS — E785 Hyperlipidemia, unspecified: Secondary | ICD-10-CM | POA: Diagnosis not present

## 2021-03-28 DIAGNOSIS — K5790 Diverticulosis of intestine, part unspecified, without perforation or abscess without bleeding: Secondary | ICD-10-CM | POA: Diagnosis not present

## 2021-03-28 DIAGNOSIS — I10 Essential (primary) hypertension: Secondary | ICD-10-CM | POA: Diagnosis not present

## 2021-03-28 DIAGNOSIS — K2270 Barrett's esophagus without dysplasia: Secondary | ICD-10-CM | POA: Diagnosis not present

## 2021-03-28 DIAGNOSIS — D692 Other nonthrombocytopenic purpura: Secondary | ICD-10-CM | POA: Diagnosis not present

## 2021-03-28 DIAGNOSIS — I700 Atherosclerosis of aorta: Secondary | ICD-10-CM | POA: Diagnosis not present

## 2021-03-28 DIAGNOSIS — I2510 Atherosclerotic heart disease of native coronary artery without angina pectoris: Secondary | ICD-10-CM | POA: Diagnosis not present

## 2021-03-28 DIAGNOSIS — K219 Gastro-esophageal reflux disease without esophagitis: Secondary | ICD-10-CM | POA: Diagnosis not present

## 2021-03-30 DIAGNOSIS — Z139 Encounter for screening, unspecified: Secondary | ICD-10-CM | POA: Diagnosis not present

## 2021-03-30 DIAGNOSIS — Z9181 History of falling: Secondary | ICD-10-CM | POA: Diagnosis not present

## 2021-03-30 DIAGNOSIS — M545 Low back pain, unspecified: Secondary | ICD-10-CM | POA: Diagnosis not present

## 2021-03-30 DIAGNOSIS — E118 Type 2 diabetes mellitus with unspecified complications: Secondary | ICD-10-CM | POA: Diagnosis not present

## 2021-03-30 DIAGNOSIS — Z1331 Encounter for screening for depression: Secondary | ICD-10-CM | POA: Diagnosis not present

## 2021-03-30 DIAGNOSIS — I10 Essential (primary) hypertension: Secondary | ICD-10-CM | POA: Diagnosis not present

## 2021-04-18 DIAGNOSIS — R072 Precordial pain: Secondary | ICD-10-CM | POA: Diagnosis not present

## 2021-04-19 DIAGNOSIS — R079 Chest pain, unspecified: Secondary | ICD-10-CM

## 2021-04-20 DIAGNOSIS — R079 Chest pain, unspecified: Secondary | ICD-10-CM | POA: Diagnosis not present

## 2021-04-21 ENCOUNTER — Telehealth: Payer: Self-pay

## 2021-04-21 NOTE — Telephone Encounter (Signed)
Message left for pt to callback to schedule an appointment in the next few weeks for hospital follow up.

## 2021-04-24 ENCOUNTER — Other Ambulatory Visit: Payer: Self-pay

## 2021-04-24 DIAGNOSIS — R0602 Shortness of breath: Secondary | ICD-10-CM | POA: Insufficient documentation

## 2021-04-24 DIAGNOSIS — I209 Angina pectoris, unspecified: Secondary | ICD-10-CM | POA: Insufficient documentation

## 2021-04-24 DIAGNOSIS — I219 Acute myocardial infarction, unspecified: Secondary | ICD-10-CM | POA: Insufficient documentation

## 2021-04-24 DIAGNOSIS — J479 Bronchiectasis, uncomplicated: Secondary | ICD-10-CM | POA: Insufficient documentation

## 2021-04-24 DIAGNOSIS — I10 Essential (primary) hypertension: Secondary | ICD-10-CM | POA: Insufficient documentation

## 2021-04-24 DIAGNOSIS — E785 Hyperlipidemia, unspecified: Secondary | ICD-10-CM | POA: Insufficient documentation

## 2021-04-24 DIAGNOSIS — K219 Gastro-esophageal reflux disease without esophagitis: Secondary | ICD-10-CM | POA: Insufficient documentation

## 2021-04-25 ENCOUNTER — Ambulatory Visit: Payer: PPO | Admitting: Cardiology

## 2021-04-25 ENCOUNTER — Other Ambulatory Visit: Payer: Self-pay

## 2021-04-25 ENCOUNTER — Encounter: Payer: Self-pay | Admitting: Cardiology

## 2021-04-25 VITALS — BP 180/100 | HR 71 | Ht 73.0 in | Wt 226.0 lb

## 2021-04-25 DIAGNOSIS — E118 Type 2 diabetes mellitus with unspecified complications: Secondary | ICD-10-CM | POA: Diagnosis not present

## 2021-04-25 DIAGNOSIS — M545 Low back pain, unspecified: Secondary | ICD-10-CM | POA: Diagnosis not present

## 2021-04-25 DIAGNOSIS — I2510 Atherosclerotic heart disease of native coronary artery without angina pectoris: Secondary | ICD-10-CM

## 2021-04-25 DIAGNOSIS — E782 Mixed hyperlipidemia: Secondary | ICD-10-CM

## 2021-04-25 DIAGNOSIS — I10 Essential (primary) hypertension: Secondary | ICD-10-CM

## 2021-04-25 DIAGNOSIS — E559 Vitamin D deficiency, unspecified: Secondary | ICD-10-CM | POA: Diagnosis not present

## 2021-04-25 DIAGNOSIS — J449 Chronic obstructive pulmonary disease, unspecified: Secondary | ICD-10-CM | POA: Diagnosis not present

## 2021-04-25 DIAGNOSIS — M25561 Pain in right knee: Secondary | ICD-10-CM | POA: Diagnosis not present

## 2021-04-25 MED ORDER — HYDROCHLOROTHIAZIDE 25 MG PO TABS: 25.0000 mg | ORAL_TABLET | Freq: Every day | ORAL | 3 refills | Status: DC

## 2021-04-25 NOTE — Patient Instructions (Signed)
Medication Instructions:  Your physician has recommended you make the following change in your medication:   Start hydrochlorothiazide 25 mg daily.   *If you need a refill on your cardiac medications before your next appointment, please call your pharmacy*   Lab Work: Your physician recommends that you return for lab work in: 2 weeks You need to have labs done when you are fasting.  You can come Monday through Friday 8:30 am to 12:00 pm and 1:15 to 4:30. You do not need to make an appointment as the order has already been placed. The labs you are going to have done are BMET and magnesium.  If you have labs (blood work) drawn today and your tests are completely normal, you will receive your results only by: Marland Kitchen MyChart Message (if you have MyChart) OR . A paper copy in the mail If you have any lab test that is abnormal or we need to change your treatment, we will call you to review the results.   Testing/Procedures: None ordered   Follow-Up: At [facility], you and your health needs are our priority.  As part of our continuing mission to provide you with exceptional heart care, we have created designated Provider Care Teams.  These Care Teams include your primary Cardiologist (physician) and Advanced Practice Providers (APPs -  Physician Assistants and Nurse Practitioners) who all work together to provide you with the care you need, when you need it.  We recommend signing up for the patient portal called "MyChart".  Sign up information is provided on this After Visit Summary.  MyChart is used to connect with patients for Virtual Visits (Telemedicine).  Patients are able to view lab/test results, encounter notes, upcoming appointments, etc.  Non-urgent messages can be sent to your provider as well.   To learn more about what you can do with MyChart, go to [email or web address].    Your next appointment:   2 month(s)  The format for your next appointment:   In Person  Provider:    Jyl Heinz, MD   Other Instructions Hydrochlorothiazide Capsules or Tablets What is this medicine? HYDROCHLOROTHIAZIDE (hye droe klor oh THYE a zide) is a diuretic. It helps you make more urine and to lose salt and excess water from your body. It treats swelling from heart, kidney, or liver disease. It also treats high blood pressure. This medicine may be used for other purposes; ask your health care provider or pharmacist if you have questions. COMMON BRAND NAME(S): Esidrix, Ezide, HydroDIURIL, Microzide, Oretic, Zide What should I tell my health care provider before I take this medicine? They need to know if you have any of these conditions:  diabetes  gout  kidney disease  liver disease  lupus  pancreatitis  an unusual or allergic reaction to hydrochlorothiazide, sulfa drugs, other medicines, foods, dyes, or preservatives  pregnant or trying to get pregnant  breast-feeding How should I use this medicine? Take this medicine by mouth. Take it as directed on the prescription label at the same time every day. You can take it with or without food. If it upsets your stomach, take it with food. Keep taking it unless your health care provider tells you to stop. Talk to your health care provider about the use of this medicine in children. While it may be prescribed for children as young as newborns for selected conditions, precautions do apply. Overdosage: If you think you have taken too much of this medicine contact a poison control center or emergency room  at once. NOTE: This medicine is only for you. Do not share this medicine with others. What if I miss a dose? If you miss a dose, take it as soon as you can. If it is almost time for your next dose, take only that dose. Do not take double or extra doses. What may interact with this medicine?  cholestyramine  colestipol  digoxin  dofetilide  lithium  medicines for blood pressure  medicines for diabetes  medicines  that relax muscles for surgery  other diuretics  steroid medicines like prednisone or cortisone This list may not describe all possible interactions. Give your health care provider a list of all the medicines, herbs, non-prescription drugs, or dietary supplements you use. Also tell them if you smoke, drink alcohol, or use illegal drugs. Some items may interact with your medicine. What should I watch for while using this medicine? Visit your health care provider for regular check ups. Check your blood pressure as directed. Ask your health care provider what your blood pressure should be. Also, find out when you should contact him or her. Do not treat yourself for coughs, colds, or pain while you are using this medicine without asking your health care provider for advice. Some medicines may increase your blood pressure. You may get drowsy or dizzy. Do not drive, use machinery, or do anything that needs mental alertness until you know how this medicine affects you. Do not stand or sit up quickly, especially if you are an older patient. This reduces the risk of dizzy or fainting spells. Alcohol can make you more drowsy and dizzy. Avoid alcoholic drinks. Talk to your health care professional about your risk of skin cancer. You may be more at risk for skin cancer if you take this medicine. This medicine can make you more sensitive to the sun. Keep out of the sun. If you cannot avoid being in the sun, wear protective clothing and use sunscreen. Do not use sun lamps or tanning beds/booths. You may need to be on a special diet while taking this medicine. Ask your health care provider. Also, find out how many glasses of fluids you need to drink each day. Check with your health care provider if you get an attack of severe diarrhea, nausea and vomiting, or if you sweat a lot. The loss of too much body fluid can make it dangerous for you to take this medicine. This medicine may increase blood sugar. Ask your  healthcare provider if changes in diet or medicines are needed if you have diabetes. What side effects may I notice from receiving this medicine? Side effects that you should report to your doctor or health care professional as soon as possible:  allergic reactions (skin rash, itching or hives; swelling of the face, lips, or tongue)  gout (severe pain, redness, or swelling in joints like the big toe)  high blood sugar (increased hunger, thirst or urination; unusually weak or tired; blurry vision)  kidney injury (trouble passing urine or change in the amount of urine)  low blood pressure (dizziness; feeling faint or lightheaded, falls; unusually weak or tired)  low potassium levels (trouble breathing; chest pain; dizziness; fast, irregular heartbeat; feeling faint or lightheaded, falls; muscle cramps or pain)  sudden change in vision or eye pain Side effects that usually do not require medical attention (report to your doctor or health care professional if they continue or are bothersome):  change in sex drive or performance  dry mouth  headache  stomach upset  This list may not describe all possible side effects. Call your doctor for medical advice about side effects. You may report side effects to FDA at 1-800-FDA-1088. Where should I keep my medicine? Keep out of the reach of children and pets. Store at room temperature between 20 and 25 degrees C (68 and 77 degrees F). Protect from light and moisture. Keep the container tightly closed. Do not freeze. Get rid of any unused medicine after the expiration date. To get rid of medicines that are no longer needed or have expired:  Take the medicine to a medicine take-back program. Check with your pharmacy or law enforcement to find a location.  If you cannot return the medicine, check the label or package insert to see if the medicine should be thrown out in the garbage or flushed down the toilet. If you are not sure, ask your health  care provider. If it is safe to put in the trash, empty the medicine out of the container. Mix the medicine with cat litter, dirt, coffee grounds, or other unwanted substance. Seal the mixture in a bag or container. Put it in the trash. NOTE: This sheet is a summary. It may not cover all possible information. If you have questions about this medicine, talk to your doctor, pharmacist, or health care provider.  2021 Elsevier/Gold Standard (2020-10-05 17:16:00)  Blood Pressure Record Sheet To take your blood pressure, you will need a blood pressure machine. You can buy a blood pressure machine (blood pressure monitor) at your clinic, drug store, or online. When choosing one, consider:  An automatic monitor that has an arm cuff.  A cuff that wraps snugly around your upper arm. You should be able to fit only one finger between your arm and the cuff.  A device that stores blood pressure reading results.  Do not choose a monitor that measures your blood pressure from your wrist or finger. Follow your health care provider's instructions for how to take your blood pressure. To use this form:  Get one reading in the morning (a.m.) 1-2 hours after you take any medicines.  Get one reading in the evening (p.m.) before supper.  Take at least 2 readings with each blood pressure check. This makes sure the results are correct. Wait 1-2 minutes between measurements.  Write down the results in the spaces on this form.  Repeat this once a week, or as told by your health care provider.    Make a follow-up appointment with your health care provider to discuss the results. Blood pressure log Date: _______________________  a.m. _____________________(1st reading) _____________________(2nd reading)  p.m. _____________________(1st reading) _____________________(2nd reading) Date: _______________________  a.m. _____________________(1st reading) _____________________(2nd reading)  p.m.  _____________________(1st reading) _____________________(2nd reading) Date: _______________________  a.m. _____________________(1st reading) _____________________(2nd reading)  p.m. _____________________(1st reading) _____________________(2nd reading) Date: _______________________  a.m. _____________________(1st reading) _____________________(2nd reading)  p.m. _____________________(1st reading) _____________________(2nd reading) Date: _______________________  a.m. _____________________(1st reading) _____________________(2nd reading)  p.m. _____________________(1st reading) _____________________(2nd reading) This information is not intended to replace advice given to you by your health care provider. Make sure you discuss any questions you have with your health care provider. Document Revised: 03/16/2020 Document Reviewed: 03/16/2020 Elsevier Patient Education  2021 [employer].

## 2021-04-25 NOTE — Progress Notes (Signed)
Cardiology Office Note:    Date:  04/25/2021   ID:  Nicholas Love, DOB 1956-09-17, MRN [id number]  PCP:  Ocie Doyne., MD  Cardiologist:  Jenean Lindau, MD   Referring MD: Ocie Doyne., MD    ASSESSMENT:    1. Coronary artery disease involving native coronary artery of native heart without angina pectoris   2. Essential hypertension   3. Primary hypertension   4. Mixed dyslipidemia    PLAN:    In order of problems listed above:  1. Coronary artery disease: Secondary prevention stressed with the patient.  Importance of compliance with diet medication stressed any vocalized understanding.  He stress test recently and echocardiogram was unremarkable.  There was no evidence of ischemia. 2. Essential hypertension: Blood pressure is elevated.  I think a significant contribution to this is his back and knee pain.  I have started him on hydrochlorothiazide 25 mg daily he will keep a track of his blood pressures and bring it to [place] in 2 weeks.  At that time we will check a Chem-7 and magnesium level.  Potassium supplementation and diet was advised.  Lifestyle modification, salt intake issues were discussed. 3. Preoperative risk stratification: In view of the testing done at the hospital including echocardiogram and stress test he is not at high risk for coronary events during the aforementioned back surgery.  Medical hemodynamic monitoring and continued uninterrupted beta-blockade will defer this to reduce the risk of coronary events.  His blood pressure needs to be optimized before the surgery.  I will adjust his blood pressure medications but I think he needs port and management of his pain issues for better blood pressure control also.  He will discuss this with his orthopedic doctors. 4. Mixed dyslipidemia: On statin therapy.  Lipids reviewed.  EKG done today was sinus rhythm and nonspecific ST-T changes. 5. Patient will be seen in follow-up appointment in 2 months or earlier if the patient  has any concerns    Medication Adjustments/Labs and Tests Ordered: Current medicines are reviewed at length with the patient today.  Concerns regarding medicines are outlined above.  No orders of the defined types were placed in this encounter.  No orders of the defined types were placed in this encounter.    Chief Complaint  Patient presents with  . Hospitalization Follow-up     History of Present Illness:    Nicholas Love is a 65 y.o. male.  Patient has past medical history of coronary artery disease post stenting, essential hypertension, dyslipidemia.  He was admitted to the hospital with elevated blood pressure.  Patient has significant issues with back pain and knee pain and has been evaluated for surgery.  I reviewed hospital records extensively.  At the time of my evaluation, the patient is alert awake oriented and in no distress.  Past Medical History:  Diagnosis Date  . Angina pectoris ([place])   . Arthritis   . Asthma   . Barrett esophagus   . Bronchiectasis ([place])   . CAD (coronary artery disease)    a. prior mild RCAx2 and LAD stenting. b. same day PCI DES to dRCA 12/2017, 100% occ oD1 with L-L collaterals, otherwise nonobstructive disease, EF normal.  . Chronic obstructive pulmonary disease ([place]) 12/17/2016   Formatting of this note might be different from the original. PFTs 01/30/17 moderate obstruction Ratio 69%, FEV1 76%, FVC 85%  . COPD (chronic obstructive pulmonary disease) ([place])   . Coronary artery disease   .  Essential hypertension 01/17/2018  . GERD (gastroesophageal reflux disease)   . Hiatal hernia   . Hyperlipemia   . Hypertension   . Mixed dyslipidemia 01/17/2018  . Myocardial infarct ([place])   . Sleep apnea   . SOB (shortness of breath)     Past Surgical History:  Procedure Laterality Date  . arthroscopic knee    . BACK SURGERY     x3 lower back  . CARDIAC CATHETERIZATION     3 stents  . CORONARY STENT INTERVENTION N/A 12/20/2017   Procedure: CORONARY  STENT INTERVENTION;  Surgeon: Martinique, Peter M, MD;  Location: [facility];  Service: Cardiovascular;  Laterality: N/A;  . FRACTURE SURGERY  1970's   ankle  . LEFT HEART CATH AND CORONARY ANGIOGRAPHY N/A 12/20/2017   Procedure: LEFT HEART CATH AND CORONARY ANGIOGRAPHY;  Surgeon: Martinique, Peter M, MD;  Location: [facility];  Service: Cardiovascular;  Laterality: N/A;    Current Medications: Current Meds  Medication Sig  . albuterol (PROVENTIL) (2.5 MG/3ML) 0.083% nebulizer solution Take 3 mLs by nebulization every 4 (four) hours as needed for shortness of breath  . aspirin EC 81 MG tablet Take 81 mg by mouth every evening.   Marland Kitchen BREO ELLIPTA 100-25 MCG/INH AEPB Inhale 1 puff into the lungs daily.   Marland Kitchen dicyclomine (BENTYL) 10 MG capsule Take 10 mg by mouth every 6 (six) hours as needed for diarrhea or loose stools.  . fluticasone (FLONASE) 50 MCG/ACT nasal spray Place 1 spray into both nostrils as needed for allergies or rhinitis.  Marland Kitchen lisinopril (ZESTRIL) 5 MG tablet Take 5 mg by mouth 2 (two) times daily.  . metoprolol succinate (TOPROL-XL) 100 MG 24 hr tablet Take 100 mg by mouth in the morning and at bedtime. Take with or immediately following a meal.  . mirtazapine (REMERON) 15 MG tablet Take 15 mg by mouth at bedtime.  . montelukast (SINGULAIR) 10 MG tablet Take 10 mg by mouth at bedtime.  . nitroGLYCERIN (NITROSTAT) 0.4 MG SL tablet Place 0.4 mg under the tongue every 5 (five) minutes as needed for chest pain.  . Oxycodone HCl 10 MG TABS Take 10 mg by mouth every 6 (six) hours as needed for pain (pain).  . pantoprazole (PROTONIX) 40 MG tablet Take 40 mg by mouth daily.   . rosuvastatin (CRESTOR) 40 MG tablet Take 40 mg by mouth every evening.   Marland Kitchen SPIRIVA RESPIMAT 1.25 MCG/ACT AERS Inhale 2 puffs into the lungs as needed for wheezing or shortness of breath.  Marland Kitchen tiZANidine (ZANAFLEX) 4 MG tablet Take 4 mg by mouth at bedtime.   . Vitamin D, Ergocalciferol, (DRISDOL) 1.25 MG (50000  UNIT) CAPS capsule Take 50,000 Units by mouth once a week.  . [DISCONTINUED] ezetimibe (ZETIA) 10 MG tablet Take 10 mg by mouth daily.     Allergies:   Ceftriaxone, Cefdinir, Ceftin [cefuroxime], and Ciprofloxacin   Social History   Socioeconomic History  . Marital status: Married    Spouse name: Not on file  . Number of children: Not on file  . Years of education: Not on file  . Highest education level: Not on file  Occupational History  . Not on file  Tobacco Use  . Smoking status: Never Smoker  . Smokeless tobacco: Never Used  Substance and Sexual Activity  . Alcohol use: Not on file  . Drug use: Not on file  . Sexual activity: Not on file  Other Topics Concern  . Not on file  Social History Narrative  . Not on file   Social Determinants of Health   Financial Resource Strain: Not on file  Food Insecurity: Not on file  Transportation Needs: Not on file  Physical Activity: Not on file  Stress: Not on file  Social Connections: Not on file     Family History: The patient's family history is not on file.  ROS:   Please see the history of present illness.    All other systems reviewed and are negative.  EKGs/Labs/Other Studies Reviewed:    The following studies were reviewed today: I discussed my findings with the patient in extensive length   Recent Labs: No results found for requested labs within last 8760 hours.  Recent Lipid Panel    Component Value Date/Time   CHOL 184 01/17/2018 0923   TRIG 123 01/17/2018 0923   HDL 42 01/17/2018 0923   CHOLHDL 4.4 01/17/2018 0923   LDLCALC 117 (H) 01/17/2018 0923    Physical Exam:    VS:  BP (!) 180/100 (BP Location: Right Arm, Patient Position: Sitting, Cuff Size: Normal)   Pulse 71   Ht 6\' 1"  (1.854 m)   Wt 226 lb (102.5 kg)   SpO2 93%   BMI 29.82 kg/m     Wt Readings from Last 3 Encounters:  04/25/21 226 lb (102.5 kg)  02/11/20 232 lb (105.2 kg)  01/17/18 214 lb (97.1 kg)     GEN: Patient is in no  acute distress HEENT: Normal NECK: No JVD; No carotid bruits LYMPHATICS: No lymphadenopathy CARDIAC: Hear sounds regular, 2/6 systolic murmur at the apex. RESPIRATORY:  Clear to auscultation without rales, wheezing or rhonchi  ABDOMEN: Soft, non-tender, non-distended MUSCULOSKELETAL:  No edema; No deformity  SKIN: Warm and dry NEUROLOGIC:  Alert and oriented x 3 PSYCHIATRIC:  Normal affect   Signed, Jenean Lindau, MD  04/25/2021 2:50 PM    [facility]

## 2021-04-25 NOTE — Addendum Note (Signed)
Addended by: Truddie Hidden on: 04/25/2021 03:37 PM   Modules accepted: Orders

## 2021-05-16 ENCOUNTER — Telehealth: Payer: Self-pay | Admitting: Cardiology

## 2021-05-16 DIAGNOSIS — J449 Chronic obstructive pulmonary disease, unspecified: Secondary | ICD-10-CM | POA: Insufficient documentation

## 2021-05-16 DIAGNOSIS — I2510 Atherosclerotic heart disease of native coronary artery without angina pectoris: Secondary | ICD-10-CM | POA: Insufficient documentation

## 2021-05-16 DIAGNOSIS — M170 Bilateral primary osteoarthritis of knee: Secondary | ICD-10-CM

## 2021-05-16 HISTORY — DX: Bilateral primary osteoarthritis of knee: M17.0

## 2021-05-16 NOTE — Telephone Encounter (Signed)
   Pt c/o of Chest Pain: STAT if CP now or developed within 24 hours  1. Are you having CP right now? No  2. Are you experiencing any other symptoms (ex. SOB, nausea, vomiting, sweating)? none  3. How long have you been experiencing CP? Since after 05/17   4. Is your CP continuous or coming and going? coming and going  5. Have you taken Nitroglycerin? Yes   Pt said he's been having on and off CP since his last visit with Dr. Geraldo Pitter. He said the most recent episode he had was today 3 hours ago. He said sometimes he have to take 2 nitroglycerin to resolve CP ?

## 2021-05-16 NOTE — Telephone Encounter (Signed)
Appt made with Dr. Geraldo Pitter for 05/17/21. Pt states that the pain has increased over the past 3 days but that it is relieved with nitroglycerin. Advised to go to the ED if not relieved. Pt verbalized understanding and had no additional questions.

## 2021-05-17 ENCOUNTER — Other Ambulatory Visit: Payer: Self-pay

## 2021-05-17 ENCOUNTER — Encounter: Payer: Self-pay | Admitting: Cardiology

## 2021-05-17 ENCOUNTER — Ambulatory Visit: Payer: PPO | Admitting: Cardiology

## 2021-05-17 VITALS — BP 158/82 | HR 69 | Ht 72.0 in | Wt 218.6 lb

## 2021-05-17 DIAGNOSIS — I209 Angina pectoris, unspecified: Secondary | ICD-10-CM | POA: Diagnosis not present

## 2021-05-17 DIAGNOSIS — I10 Essential (primary) hypertension: Secondary | ICD-10-CM | POA: Diagnosis not present

## 2021-05-17 DIAGNOSIS — I2510 Atherosclerotic heart disease of native coronary artery without angina pectoris: Secondary | ICD-10-CM

## 2021-05-17 DIAGNOSIS — E782 Mixed hyperlipidemia: Secondary | ICD-10-CM

## 2021-05-17 NOTE — H&P (View-Only) (Signed)
Cardiology Office Note:    Date:  05/17/2021   ID:  Nicholas Love, DOB 06/12/1956, MRN [id number]  PCP:  Ocie Doyne., MD  Cardiologist:  Jenean Lindau, MD   Referring MD: Ocie Doyne., MD    ASSESSMENT:    1. Coronary artery disease involving native coronary artery of native heart without angina pectoris   2. Essential hypertension   3. Mixed dyslipidemia   4. Angina pectoris ([place])    PLAN:    In order of problems listed above:  1. Angina pectoris and coronary artery disease: Secondary prevention stressed with patient.  Importance of compliance with diet medication stressed any vocalized understanding.  His symptoms are very concerning and consistent with exercise and exertion.  He is planning to undergo knee replacement surgery.  This is a very risky proposition and therefore I recommended to him the following.I discussed coronary angiography and left heart catheterization with the patient at extensive length. Procedure, benefits and potential risks were explained. Patient had multiple questions which were answered to the patient's satisfaction. Patient agreed and consented for the procedure. Further recommendations will be made based on the findings of the coronary angiography. In the interim. The patient has any significant symptoms he knows to go to the nearest emergency room. 2. Essential hypertension: Blood pressure stable and diet was emphasized.  It appears that his pain in his knee and more predominantly in his back contributes to elevated blood pressure. 3. Mixed dyslipidemia: Patient is on appropriate medications and lipids are being monitored.  Diet emphasized. 4. Further recommendations will be made based on the findings of the coronary angiography.  It is possible that our interventional colleagues may want to use stents that may facilitate him getting surgery sooner than later in view of the fact that he is in significant pain and needs his knee surgery fairly soon.   Patient understands these implications and ramifications of coronary interventions.   Medication Adjustments/Labs and Tests Ordered: Current medicines are reviewed at length with the patient today.  Concerns regarding medicines are outlined above.  Orders Placed This Encounter  Procedures  . EKG 12-Lead   No orders of the defined types were placed in this encounter.    No chief complaint on file.    History of Present Illness:    Nicholas Love is a 65 y.o. male.  Patient has past medical history of coronary artery disease and stenting in the past, essential hypertension, mixed dyslipidemia.  He is contemplating knee surgery.  His blood pressure was elevated the last time.  And we were in the process of optimizing it.  Patient mentions to me that his blood pressure is better.  He was trying to wash his car yesterday and had substernal chest tightness going to the neck into the arm and the patient is concerned about it.  This is relieved with nitroglycerin.  At the time of my evaluation, the patient is alert awake oriented and in no distress.  Past Medical History:  Diagnosis Date  . Angina pectoris ([place])   . Arthritis   . Asthma   . Barrett esophagus   . Bilateral primary osteoarthritis of knee 05/16/2021  . Bronchiectasis ([place])   . CAD (coronary artery disease)    a. prior mild RCAx2 and LAD stenting. b. same day PCI DES to dRCA 12/2017, 100% occ oD1 with L-L collaterals, otherwise nonobstructive disease, EF normal.  . Chronic obstructive pulmonary disease ([place]) 12/17/2016   Formatting of this note  might be different from the original. PFTs 01/30/17 moderate obstruction Ratio 69%, FEV1 76%, FVC 85%  . COPD (chronic obstructive pulmonary disease) ([place])   . Coronary artery disease   . Essential hypertension 01/17/2018  . GERD (gastroesophageal reflux disease)   . Hiatal hernia   . Hyperlipemia   . Hypertension   . Mixed dyslipidemia 01/17/2018  . Myocardial infarct ([place])   . Sleep apnea    . SOB (shortness of breath)     Past Surgical History:  Procedure Laterality Date  . arthroscopic knee    . BACK SURGERY     x3 lower back  . CARDIAC CATHETERIZATION     3 stents  . CORONARY STENT INTERVENTION N/A 12/20/2017   Procedure: CORONARY STENT INTERVENTION;  Surgeon: Martinique, Peter M, MD;  Location: [facility];  Service: Cardiovascular;  Laterality: N/A;  . FRACTURE SURGERY  1970's   ankle  . LEFT HEART CATH AND CORONARY ANGIOGRAPHY N/A 12/20/2017   Procedure: LEFT HEART CATH AND CORONARY ANGIOGRAPHY;  Surgeon: Martinique, Peter M, MD;  Location: [facility];  Service: Cardiovascular;  Laterality: N/A;    Current Medications: Current Meds  Medication Sig  . albuterol (PROVENTIL) (2.5 MG/3ML) 0.083% nebulizer solution Take 2.5 mg by nebulization every 2 (two) hours as needed for wheezing or shortness of breath.  Marland Kitchen aspirin EC 81 MG tablet Take 81 mg by mouth every evening.   Marland Kitchen BREO ELLIPTA 100-25 MCG/INH AEPB Inhale 1 puff into the lungs daily.   Marland Kitchen dicyclomine (BENTYL) 10 MG capsule Take 10 mg by mouth every 6 (six) hours as needed for diarrhea or loose stools.  . fluticasone (FLONASE) 50 MCG/ACT nasal spray Place 1 spray into both nostrils as needed for allergies or rhinitis.  . hydrochlorothiazide (HYDRODIURIL) 25 MG tablet Take 1 tablet (25 mg total) by mouth daily.  Marland Kitchen lisinopril (ZESTRIL) 40 MG tablet Take 40 mg by mouth daily.  . metoprolol succinate (TOPROL-XL) 100 MG 24 hr tablet Take 100 mg by mouth in the morning and at bedtime. Take with or immediately following a meal.  . mirtazapine (REMERON) 15 MG tablet Take 15 mg by mouth at bedtime.  . nitroGLYCERIN (NITROSTAT) 0.4 MG SL tablet Place 0.4 mg under the tongue every 5 (five) minutes as needed for chest pain.  . Oxycodone HCl 10 MG TABS Take 10 mg by mouth every 6 (six) hours as needed for pain (pain).  . pantoprazole (PROTONIX) 40 MG tablet Take 40 mg by mouth daily.   . rosuvastatin (CRESTOR) 40 MG  tablet Take 40 mg by mouth every evening.   Marland Kitchen SPIRIVA RESPIMAT 1.25 MCG/ACT AERS Inhale 2 puffs into the lungs as needed for wheezing or shortness of breath.  Marland Kitchen tiZANidine (ZANAFLEX) 4 MG tablet Take 4 mg by mouth at bedtime.   . Vitamin D, Ergocalciferol, (DRISDOL) 1.25 MG (50000 UNIT) CAPS capsule Take 50,000 Units by mouth once a week.     Allergies:   Ceftriaxone, Cefdinir, Ceftin [cefuroxime], and Ciprofloxacin   Social History   Socioeconomic History  . Marital status: Married    Spouse name: Not on file  . Number of children: Not on file  . Years of education: Not on file  . Highest education level: Not on file  Occupational History  . Not on file  Tobacco Use  . Smoking status: Never Smoker  . Smokeless tobacco: Never Used  Substance and Sexual Activity  . Alcohol use: Not on file  . Drug  use: Not on file  . Sexual activity: Not on file  Other Topics Concern  . Not on file  Social History Narrative  . Not on file   Social Determinants of Health   Financial Resource Strain: Not on file  Food Insecurity: Not on file  Transportation Needs: Not on file  Physical Activity: Not on file  Stress: Not on file  Social Connections: Not on file     Family History: The patient's Family history is unknown by patient.  ROS:   Please see the history of present illness.    All other systems reviewed and are negative.  EKGs/Labs/Other Studies Reviewed:    The following studies were reviewed today: I discussed my findings with the patient at length.   Recent Labs: No results found for requested labs within last 8760 hours.  Recent Lipid Panel    Component Value Date/Time   CHOL 184 01/17/2018 0923   TRIG 123 01/17/2018 0923   HDL 42 01/17/2018 0923   CHOLHDL 4.4 01/17/2018 0923   LDLCALC 117 (H) 01/17/2018 0923    Physical Exam:    VS:  BP (!) 158/82   Pulse 69   Ht 6' (1.829 m)   Wt 218 lb 9.6 oz (99.2 kg)   SpO2 93%   BMI 29.65 kg/m     Wt Readings  from Last 3 Encounters:  05/17/21 218 lb 9.6 oz (99.2 kg)  04/25/21 226 lb (102.5 kg)  02/11/20 232 lb (105.2 kg)     GEN: Patient is in no acute distress HEENT: Normal NECK: No JVD; No carotid bruits LYMPHATICS: No lymphadenopathy CARDIAC: Hear sounds regular, 2/6 systolic murmur at the apex. RESPIRATORY:  Clear to auscultation without rales, wheezing or rhonchi  ABDOMEN: Soft, non-tender, non-distended MUSCULOSKELETAL:  No edema; No deformity  SKIN: Warm and dry NEUROLOGIC:  Alert and oriented x 3 PSYCHIATRIC:  Normal affect   Signed, Jenean Lindau, MD  05/17/2021 4:25 PM    [facility]

## 2021-05-17 NOTE — Patient Instructions (Signed)
Medication Instructions:  Your physician has recommended you make the following change in your medication:   Take 81 mg coated aspirin daily. Use nitroglycerin as needed for chest pain.  *If you need a refill on your cardiac medications before your next appointment, please call your pharmacy*   Lab Work: Your physician recommends that you have a BMET and CBC today in the office for your upcoming procedure.  If you have labs (blood work) drawn today and your tests are completely normal, you will receive your results only by: Marland Kitchen MyChart Message (if you have MyChart) OR . A paper copy in the mail If you have any lab test that is abnormal or we need to change your treatment, we will call you to review the results.   Testing/Procedures: Your physician has requested that you have a cardiac catheterization. Cardiac catheterization is used to diagnose and/or treat various heart conditions. Doctors may recommend this procedure for a number of different reasons. The most common reason is to evaluate chest pain. Chest pain can be a symptom of coronary artery disease (CAD), and cardiac catheterization can show whether plaque is narrowing or blocking your heart's arteries. This procedure is also used to evaluate the valves, as well as measure the blood flow and oxygen levels in different parts of your heart. For further information please visit [email or web address]. Please follow instruction sheet, as given.      [place] [address] [place] [address] Dept: [phone] Loc: [facility] M Fifield  05/17/2021  You are scheduled for a Cardiac Catheterization on Monday, June 13 with Dr. Shelva Majestic.  1. Please arrive at the [facility] (Main Entrance A) at [facility]: [address] [place], [place] [address] at 7:00 AM (This time is two hours before your procedure to ensure your preparation).  Free valet parking service is available.   Special note: Every effort is made to have your procedure done on time. Please understand that emergencies sometimes delay scheduled procedures.  2. Diet: Do not eat solid foods after midnight.  The patient may have clear liquids until 5am upon the day of the procedure.  3. Labs: You had your labs done in the office today.   4. Medication instructions in preparation for your procedure:   Contrast Allergy: No   Stop taking, Lisinopril (Zestril or Prinivil) Monday, June 13,, HTCZ (Hydrochlorothiazide) Monday, June 13,   On the morning of your procedure, take your Aspirin and any morning medicines NOT listed above.  You may use sips of water.  5. Plan for one night stay--bring personal belongings. 6. Bring a current list of your medications and current insurance cards. 7. You MUST have a responsible person to drive you home. 8. Someone MUST be with you the first 24 hours after you arrive home or your discharge will be delayed. 9. Please wear clothes that are easy to get on and off and wear slip-on shoes.  Thank you for allowing [place] to care for you!   -- [place] [facility]  Follow-Up: At [facility], you and your health needs are our priority.  As part of our continuing mission to provide you with exceptional heart care, we have created designated Provider Care Teams.  These Care Teams include your primary Cardiologist (physician) and Advanced Practice Providers (APPs -  Physician Assistants and Nurse Practitioners) who all work together to provide you with the care you need, when  you need it.  We recommend signing up for the patient portal called "MyChart".  Sign up information is provided on this After Visit Summary.  MyChart is used to connect with patients for Virtual Visits (Telemedicine).  Patients are able to view lab/test results, encounter notes, upcoming appointments, etc.  Non-urgent messages can be sent to  your provider as well.   To learn more about what you can do with MyChart, go to [email or web address].    Your next appointment:   3 month(s)  The format for your next appointment:   In Person  Provider:   Jyl Heinz, MD   Other Instructions  Coronary Angiogram With Stent Coronary angiogram with stent placement is a procedure to widen or open a narrow blood vessel of the heart (coronary artery). Arteries may become blocked by cholesterol buildup (plaques) in the lining of the artery wall. When a coronary artery becomes partially blocked, blood flow to that area decreases. This may lead to chest pain or a heart attack (myocardial infarction). A stent is a small piece of metal that looks like mesh or spring. Stent placement may be done as treatment after a heart attack, or to prevent a heart attack if a blocked artery is found by a coronary angiogram. Let your health care provider know about:  Any allergies you have, including allergies to medicines or contrast dye.  All medicines you are taking, including vitamins, herbs, eye drops, creams, and over-the-counter medicines.  Any problems you or family members have had with anesthetic medicines.  Any blood disorders you have.  Any surgeries you have had.  Any medical conditions you have, including kidney problems or kidney failure.  Whether you are pregnant or may be pregnant.  Whether you are breastfeeding. What are the risks? Generally, this is a safe procedure. However, serious problems may occur, including: 1. Damage to nearby structures or organs, such as the heart, blood vessels, or kidneys. 2. A return of blockage. 3. Bleeding, infection, or bruising at the insertion site. 4. A collection of blood under the skin (hematoma) at the insertion site. 5. A blood clot in another part of the body. 6. Allergic reaction to medicines or dyes. 7. Bleeding into the abdomen (retroperitoneal bleeding). 8. Stroke  (rare). 9. Heart attack (rare). What happens before the procedure? Staying hydrated Follow instructions from your health care provider about hydration, which may include: 1. Up to 2 hours before the procedure - you may continue to drink clear liquids, such as water, clear fruit juice, black coffee, and plain tea.    Eating and drinking restrictions Follow instructions from your health care provider about eating and drinking, which may include: 1. 8 hours before the procedure - stop eating heavy meals or foods, such as meat, fried foods, or fatty foods. 2. 6 hours before the procedure - stop eating light meals or foods, such as toast or cereal. 3. 2 hours before the procedure - stop drinking clear liquids. Medicines Ask your health care provider about: 1. Changing or stopping your regular medicines. This is especially important if you are taking diabetes medicines or blood thinners. 2. Taking medicines such as aspirin and ibuprofen. These medicines can thin your blood. Do not take these medicines unless your health care provider tells you to take them. ? Generally, aspirin is recommended before a thin tube, called a catheter, is passed through a blood vessel and inserted into the heart (cardiac catheterization). 3. Taking over-the-counter medicines, vitamins, herbs, and supplements. General instructions 1. Do  not use any products that contain nicotine or tobacco for at least 4 weeks before the procedure. These products include cigarettes, e-cigarettes, and chewing tobacco. If you need help quitting, ask your health care provider. 2. Plan to have someone take you home from the hospital or clinic. 3. If you will be going home right after the procedure, plan to have someone with you for 24 hours. 4. You may have tests and imaging procedures. 5. Ask your health care provider: 1. How your insertion site will be marked. Ask which artery will be used for the procedure. 2. What steps will be taken to  help prevent infection. These may include:  Removing hair at the insertion site.  Washing skin with a germ-killing soap.  Taking antibiotic medicine. What happens during the procedure? 1. An IV will be inserted into one of your veins. 2. Electrodes may be placed on your chest to monitor your heart rate during the procedure. 3. You will be given one or more of the following: ? A medicine to help you relax (sedative). ? A medicine to numb the area (local anesthetic) for catheter insertion. 4. A small incision will be made for catheter insertion. 5. The catheter will be inserted into an artery using a guide wire. The location may be in your groin, your wrist, or the fold of your arm (near your elbow). 6. An X-ray procedure (fluoroscopy) will be used to help guide the catheter to the opening of the heart arteries. 7. A dye will be injected into the catheter. X-rays will be taken. The dye helps to show where any narrowing or blockages are located in the arteries. 8. Tell your health care provider if you have chest pain or trouble breathing. 9. A tiny wire will be guided to the blocked spot, and a balloon will be inflated to make the artery wider. 10. The stent will be expanded to crush the plaques into the wall of the vessel. The stent will hold the area open and improve the blood flow. Most stents have a drug coating to reduce the risk of the stent narrowing over time. 11. The artery may be made wider using a drill, laser, or other tools that remove plaques. 12. The catheter will be removed when the blood flow improves. The stent will stay where it was placed, and the lining of the artery will grow over it. 13. A bandage (dressing) will be placed on the insertion site. Pressure will be applied to stop bleeding. 14. The IV will be removed. This procedure may vary among health care providers and hospitals.    What happens after the procedure?  Your blood pressure, heart rate, breathing rate,  and blood oxygen level will be monitored until you leave the hospital or clinic.  If the procedure is done through the leg, you will lie flat in bed for a few hours or for as long as told by your health care provider. You will be instructed not to bend or cross your legs.  The insertion site and the pulse in your foot or wrist will be checked often.  You may have more blood tests, X-rays, and a test that records the electrical activity of your heart (electrocardiogram, or ECG).  Do not drive for 24 hours if you were given a sedative during your procedure. Summary  Coronary angiogram with stent placement is a procedure to widen or open a narrowed coronary artery. This is done to treat heart problems.  Before the procedure, let your  health care provider know about all the medical conditions and surgeries you have or have had.  This is a safe procedure. However, some problems may occur, including damage to nearby structures or organs, bleeding, blood clots, or allergies.  Follow your health care provider's instructions about eating, drinking, medicines, and other lifestyle changes, such as quitting tobacco use before the procedure. This information is not intended to replace advice given to you by your health care provider. Make sure you discuss any questions you have with your health care provider. Document Revised: 06/17/2019 Document Reviewed: 06/17/2019 Elsevier Patient Education  2021 [place].  Aspirin and Your Heart Aspirin is a medicine that prevents the platelets in your blood from sticking together. Platelets are the cells that your blood uses for clotting. Aspirin can be used to help reduce the risk of blood clots, heart attacks, and other heart-related problems. What are the risks? Daily use of aspirin can cause side effects. Some of these include:  Bleeding. Bleeding can be minor or serious. An example of minor bleeding is bleeding from a cut, and the bleeding does not stop.  An example of more serious bleeding is stomach bleeding or, rarely, bleeding into the brain. Your risk of bleeding increases if you are also taking NSAIDs, such as ibuprofen.  Increased bruising.  Upset stomach.  An allergic reaction. People who have growths inside the nose (nasal polyps) have an increased risk of developing an aspirin allergy. How to use aspirin to care for your heart 10. Take aspirin only as told by your health care provider. Make sure that you understand how much to take and what form to take. The two forms of aspirin are: 1. Non-enteric-coated.This type of aspirin does not have a coating and is absorbed quickly. This type of aspirin also comes in a chewable form. 2. Enteric-coated. This type of aspirin has a coating that releases the medicine very slowly. Enteric-coated aspirin might cause less stomach upset than non-enteric-coated aspirin. This type of aspirin should not be chewed or crushed. 11. Work with your health care provider to find out whether it is safe and beneficial for you to take aspirin daily. Taking aspirin daily may be helpful if: 1. You have had a heart attack or chest pain, or you are at risk for a heart attack. 2. You have a condition in which certain heart vessels are blocked (coronary artery disease), and you have had a procedure to treat it. Examples are:  Open-heart surgery, such as coronary artery bypass surgery (CABG).  Coronary angioplasty,which is done to widen a blood vessel of your heart.  Having a small mesh tube, or stent, placed in your coronary artery. 3. You have had certain types of stroke or a mini-stroke known as a transient ischemic attack (TIA). 4. You have a narrowing of the arteries that supply the limbs (peripheral artery disease, or PAD). 5. You have long-term (chronic) heart rhythm problems, such as atrial fibrillation, and your health care provider thinks aspirin may help. 6. You have valve disease or have had surgery on a  valve. 7. You are considered at increased risk of developing coronary artery disease or PAD.    Follow these instructions at home Medicines 2. Take over-the-counter and prescription medicines only as told by your health care provider. 3. If you are taking blood thinners: ? Talk with your health care provider before you take any medicines that contain aspirin or NSAIDs, such as ibuprofen. These medicines increase your risk for dangerous bleeding. ?  Take your medicine exactly as told, at the same time every day. ? Avoid activities that could cause injury or bruising, and follow instructions about how to prevent falls. ? Wear a medical alert bracelet or carry a card that lists what medicines you take. General instructions 4. Do not drink alcohol if: 1. Your health care provider tells you not to drink. 2. You are pregnant, may be pregnant, or are planning to become pregnant. 5. If you drink alcohol: 1. Limit how much you use to:  0-1 drink a day for women.  0-2 drinks a day for men. 2. Be aware of how much alcohol is in your drink. In the [place] one drink equals one 12 oz bottle of beer (355 mL), one 5 oz glass of wine (148 mL), or one 1 oz glass of hard liquor (44 mL). 6. Keep all follow-up visits as told by your health care provider. This is important. Where to find more information 4. The American Heart Association: www.heart.org Contact a health care provider if you have: 6. Unusual bleeding or bruising. 7. Stomach pain or nausea. 8. Ringing in your ears. 9. An allergic reaction that causes hives, itchy skin, or swelling of the lips, tongue, or face. Get help right away if: 15. You notice that your bowel movements are bloody, or dark red or black in color. 16. You vomit or cough up blood. 17. You have blood in your urine. 18. You cough, breathe loudly (wheeze), or feel short of breath. 19. You have chest pain, especially if the pain spreads to your arms, back, neck, or  jaw. 20. You have a headache with confusion. You have any symptoms of a stroke. "BE FAST" is an easy way to remember the main warning signs of a stroke:  B - Balance. Signs are dizziness, sudden trouble walking, or loss of balance.  E - Eyes. Signs are trouble seeing or a sudden change in vision.  F - Face. Signs are sudden weakness or numbness of the face, or the face or eyelid drooping on one side.  A - Arms. Signs are weakness or numbness in an arm. This happens suddenly and usually on one side of the body.  S - Speech. Signs are sudden trouble speaking, slurred speech, or trouble understanding what people say.  T - Time. Time to call emergency services. Write down what time symptoms started. You have other signs of a stroke, such as:  A sudden, severe headache with no known cause.  Nausea or vomiting.  Seizure. These symptoms may represent a serious problem that is an emergency. Do not wait to see if the symptoms will go away. Get medical help right away. Call your local emergency services (911 in the [place] Do not drive yourself to the hospital. Summary  Aspirin use can help reduce the risk of blood clots, heart attacks, and other heart-related problems.  Daily use of aspirin can cause side effects.  Take aspirin only as told by your health care provider. Make sure that you understand how much to take and what form to take.  Your health care provider will help you determine whether it is safe and beneficial for you to take aspirin daily. This information is not intended to replace advice given to you by your health care provider. Make sure you discuss any questions you have with your health care provider. Document Revised: 08/31/2019 Document Reviewed: 08/31/2019 Elsevier Patient Education  2021 [place]. Nitroglycerin sublingual tablets What is this medicine? NITROGLYCERIN (nye  troe GLI ser in) is a type of vasodilator. It relaxes blood vessels, increasing the blood  and oxygen supply to your heart. This medicine is used to relieve chest pain caused by angina. It is also used to prevent chest pain before activities like climbing stairs, going outdoors in cold weather, or sexual activity. This medicine may be used for other purposes; ask your health care provider or pharmacist if you have questions. COMMON BRAND NAME(S): Nitroquick, Nitrostat, Nitrotab What should I tell my health care provider before I take this medicine? They need to know if you have any of these conditions:  anemia  head injury, recent stroke, or bleeding in the brain  liver disease  previous heart attack  an unusual or allergic reaction to nitroglycerin, other medicines, foods, dyes, or preservatives  pregnant or trying to get pregnant  breast-feeding How should I use this medicine? Take this medicine by mouth as needed. Use at the first sign of an angina attack (chest pain or tightness). You can also take this medicine 5 to 10 minutes before an event likely to produce chest pain. Follow the directions exactly as written on the prescription label. Place one tablet under your tongue and let it dissolve. Do not swallow whole. Replace the dose if you accidentally swallow it. It will help if your mouth is not dry. Saliva around the tablet will help it to dissolve more quickly. Do not eat or drink, smoke or chew tobacco while a tablet is dissolving. Sit down when taking this medicine. In an angina attack, you should feel better within 5 minutes after your first dose. You can take a dose every 5 minutes up to a total of 3 doses. If you do not feel better or feel worse after 1 dose, call 9-1-1 at once. Do not take more than 3 doses in 15 minutes. Your health care provider might give you other directions. Follow those directions if he or she does. Do not take your medicine more often than directed. Talk to your health care provider about the use of this medicine in children. Special care may be  needed. Overdosage: If you think you have taken too much of this medicine contact a poison control center or emergency room at once. NOTE: This medicine is only for you. Do not share this medicine with others. What if I miss a dose? This does not apply. This medicine is only used as needed. What may interact with this medicine? Do not take this medicine with any of the following medications: 12. certain migraine medicines like ergotamine and dihydroergotamine (DHE) 13. medicines used to treat erectile dysfunction like sildenafil, tadalafil, and vardenafil 14. riociguat This medicine may also interact with the following medications: 4. alteplase 5. aspirin 6. heparin 7. medicines for high blood pressure 8. medicines for mental depression 9. other medicines used to treat angina 10. phenothiazines like chlorpromazine, mesoridazine, prochlorperazine, thioridazine This list may not describe all possible interactions. Give your health care provider a list of all the medicines, herbs, non-prescription drugs, or dietary supplements you use. Also tell them if you smoke, drink alcohol, or use illegal drugs. Some items may interact with your medicine. What should I watch for while using this medicine? Tell your doctor or health care professional if you feel your medicine is no longer working. Keep this medicine with you at all times. Sit or lie down when you take your medicine to prevent falling if you feel dizzy or faint after using it. Try to remain  calm. This will help you to feel better faster. If you feel dizzy, take several deep breaths and lie down with your feet propped up, or bend forward with your head resting between your knees. You may get drowsy or dizzy. Do not drive, use machinery, or do anything that needs mental alertness until you know how this drug affects you. Do not stand or sit up quickly, especially if you are an older patient. This reduces the risk of dizzy or fainting spells.  Alcohol can make you more drowsy and dizzy. Avoid alcoholic drinks. Do not treat yourself for coughs, colds, or pain while you are taking this medicine without asking your doctor or health care professional for advice. Some ingredients may increase your blood pressure. What side effects may I notice from receiving this medicine? Side effects that you should report to your doctor or health care professional as soon as possible: 7. allergic reactions (skin rash, itching or hives; swelling of the face, lips, or tongue) 8. low blood pressure (dizziness; feeling faint or lightheaded, falls; unusually weak or tired) 9. low red blood cell counts (trouble breathing; feeling faint; lightheaded, falls; unusually weak or tired) Side effects that usually do not require medical attention (report to your doctor or health care professional if they continue or are bothersome): 5. facial flushing (redness) 6. headache 7. nausea, vomiting This list may not describe all possible side effects. Call your doctor for medical advice about side effects. You may report side effects to FDA at 1-800-FDA-1088. Where should I keep my medicine? Keep out of the reach of children. Store at room temperature between 20 and 25 degrees C (68 and 77 degrees F). Store in [employer]. Protect from light and moisture. Keep tightly closed. Throw away any unused medicine after the expiration date. NOTE: This sheet is a summary. It may not cover all possible information. If you have questions about this medicine, talk to your doctor, pharmacist, or health care provider.  2021 Elsevier/Gold Standard (2018-08-27 16:46:32)

## 2021-05-17 NOTE — Progress Notes (Signed)
Cardiology Office Note:    Date:  05/17/2021   ID:  Nicholas Love, DOB 09-Nov-1956, MRN [id number]  PCP:  Ocie Doyne., MD  Cardiologist:  Jenean Lindau, MD   Referring MD: Ocie Doyne., MD    ASSESSMENT:    1. Coronary artery disease involving native coronary artery of native heart without angina pectoris   2. Essential hypertension   3. Mixed dyslipidemia   4. Angina pectoris ([place])    PLAN:    In order of problems listed above:  1. Angina pectoris and coronary artery disease: Secondary prevention stressed with patient.  Importance of compliance with diet medication stressed any vocalized understanding.  His symptoms are very concerning and consistent with exercise and exertion.  He is planning to undergo knee replacement surgery.  This is a very risky proposition and therefore I recommended to him the following.I discussed coronary angiography and left heart catheterization with the patient at extensive length. Procedure, benefits and potential risks were explained. Patient had multiple questions which were answered to the patient's satisfaction. Patient agreed and consented for the procedure. Further recommendations will be made based on the findings of the coronary angiography. In the interim. The patient has any significant symptoms he knows to go to the nearest emergency room. 2. Essential hypertension: Blood pressure stable and diet was emphasized.  It appears that his pain in his knee and more predominantly in his back contributes to elevated blood pressure. 3. Mixed dyslipidemia: Patient is on appropriate medications and lipids are being monitored.  Diet emphasized. 4. Further recommendations will be made based on the findings of the coronary angiography.  It is possible that our interventional colleagues may want to use stents that may facilitate him getting surgery sooner than later in view of the fact that he is in significant pain and needs his knee surgery fairly soon.   Patient understands these implications and ramifications of coronary interventions.   Medication Adjustments/Labs and Tests Ordered: Current medicines are reviewed at length with the patient today.  Concerns regarding medicines are outlined above.  Orders Placed This Encounter  Procedures  . EKG 12-Lead   No orders of the defined types were placed in this encounter.    No chief complaint on file.    History of Present Illness:    Nicholas Love is a 65 y.o. male.  Patient has past medical history of coronary artery disease and stenting in the past, essential hypertension, mixed dyslipidemia.  He is contemplating knee surgery.  His blood pressure was elevated the last time.  And we were in the process of optimizing it.  Patient mentions to me that his blood pressure is better.  He was trying to wash his car yesterday and had substernal chest tightness going to the neck into the arm and the patient is concerned about it.  This is relieved with nitroglycerin.  At the time of my evaluation, the patient is alert awake oriented and in no distress.  Past Medical History:  Diagnosis Date  . Angina pectoris ([place])   . Arthritis   . Asthma   . Barrett esophagus   . Bilateral primary osteoarthritis of knee 05/16/2021  . Bronchiectasis ([place])   . CAD (coronary artery disease)    a. prior mild RCAx2 and LAD stenting. b. same day PCI DES to dRCA 12/2017, 100% occ oD1 with L-L collaterals, otherwise nonobstructive disease, EF normal.  . Chronic obstructive pulmonary disease ([place]) 12/17/2016   Formatting of this note  might be different from the original. PFTs 01/30/17 moderate obstruction Ratio 69%, FEV1 76%, FVC 85%  . COPD (chronic obstructive pulmonary disease) ([place])   . Coronary artery disease   . Essential hypertension 01/17/2018  . GERD (gastroesophageal reflux disease)   . Hiatal hernia   . Hyperlipemia   . Hypertension   . Mixed dyslipidemia 01/17/2018  . Myocardial infarct ([place])   . Sleep apnea    . SOB (shortness of breath)     Past Surgical History:  Procedure Laterality Date  . arthroscopic knee    . BACK SURGERY     x3 lower back  . CARDIAC CATHETERIZATION     3 stents  . CORONARY STENT INTERVENTION N/A 12/20/2017   Procedure: CORONARY STENT INTERVENTION;  Surgeon: Martinique, Peter M, MD;  Location: [facility];  Service: Cardiovascular;  Laterality: N/A;  . FRACTURE SURGERY  1970's   ankle  . LEFT HEART CATH AND CORONARY ANGIOGRAPHY N/A 12/20/2017   Procedure: LEFT HEART CATH AND CORONARY ANGIOGRAPHY;  Surgeon: Martinique, Peter M, MD;  Location: [facility];  Service: Cardiovascular;  Laterality: N/A;    Current Medications: Current Meds  Medication Sig  . albuterol (PROVENTIL) (2.5 MG/3ML) 0.083% nebulizer solution Take 2.5 mg by nebulization every 2 (two) hours as needed for wheezing or shortness of breath.  Marland Kitchen aspirin EC 81 MG tablet Take 81 mg by mouth every evening.   Marland Kitchen BREO ELLIPTA 100-25 MCG/INH AEPB Inhale 1 puff into the lungs daily.   Marland Kitchen dicyclomine (BENTYL) 10 MG capsule Take 10 mg by mouth every 6 (six) hours as needed for diarrhea or loose stools.  . fluticasone (FLONASE) 50 MCG/ACT nasal spray Place 1 spray into both nostrils as needed for allergies or rhinitis.  . hydrochlorothiazide (HYDRODIURIL) 25 MG tablet Take 1 tablet (25 mg total) by mouth daily.  Marland Kitchen lisinopril (ZESTRIL) 40 MG tablet Take 40 mg by mouth daily.  . metoprolol succinate (TOPROL-XL) 100 MG 24 hr tablet Take 100 mg by mouth in the morning and at bedtime. Take with or immediately following a meal.  . mirtazapine (REMERON) 15 MG tablet Take 15 mg by mouth at bedtime.  . nitroGLYCERIN (NITROSTAT) 0.4 MG SL tablet Place 0.4 mg under the tongue every 5 (five) minutes as needed for chest pain.  . Oxycodone HCl 10 MG TABS Take 10 mg by mouth every 6 (six) hours as needed for pain (pain).  . pantoprazole (PROTONIX) 40 MG tablet Take 40 mg by mouth daily.   . rosuvastatin (CRESTOR) 40 MG  tablet Take 40 mg by mouth every evening.   Marland Kitchen SPIRIVA RESPIMAT 1.25 MCG/ACT AERS Inhale 2 puffs into the lungs as needed for wheezing or shortness of breath.  Marland Kitchen tiZANidine (ZANAFLEX) 4 MG tablet Take 4 mg by mouth at bedtime.   . Vitamin D, Ergocalciferol, (DRISDOL) 1.25 MG (50000 UNIT) CAPS capsule Take 50,000 Units by mouth once a week.     Allergies:   Ceftriaxone, Cefdinir, Ceftin [cefuroxime], and Ciprofloxacin   Social History   Socioeconomic History  . Marital status: Married    Spouse name: Not on file  . Number of children: Not on file  . Years of education: Not on file  . Highest education level: Not on file  Occupational History  . Not on file  Tobacco Use  . Smoking status: Never Smoker  . Smokeless tobacco: Never Used  Substance and Sexual Activity  . Alcohol use: Not on file  . Drug  use: Not on file  . Sexual activity: Not on file  Other Topics Concern  . Not on file  Social History Narrative  . Not on file   Social Determinants of Health   Financial Resource Strain: Not on file  Food Insecurity: Not on file  Transportation Needs: Not on file  Physical Activity: Not on file  Stress: Not on file  Social Connections: Not on file     Family History: The patient's Family history is unknown by patient.  ROS:   Please see the history of present illness.    All other systems reviewed and are negative.  EKGs/Labs/Other Studies Reviewed:    The following studies were reviewed today: I discussed my findings with the patient at length.   Recent Labs: No results found for requested labs within last 8760 hours.  Recent Lipid Panel    Component Value Date/Time   CHOL 184 01/17/2018 0923   TRIG 123 01/17/2018 0923   HDL 42 01/17/2018 0923   CHOLHDL 4.4 01/17/2018 0923   LDLCALC 117 (H) 01/17/2018 0923    Physical Exam:    VS:  BP (!) 158/82   Pulse 69   Ht 6' (1.829 m)   Wt 218 lb 9.6 oz (99.2 kg)   SpO2 93%   BMI 29.65 kg/m     Wt Readings  from Last 3 Encounters:  05/17/21 218 lb 9.6 oz (99.2 kg)  04/25/21 226 lb (102.5 kg)  02/11/20 232 lb (105.2 kg)     GEN: Patient is in no acute distress HEENT: Normal NECK: No JVD; No carotid bruits LYMPHATICS: No lymphadenopathy CARDIAC: Hear sounds regular, 2/6 systolic murmur at the apex. RESPIRATORY:  Clear to auscultation without rales, wheezing or rhonchi  ABDOMEN: Soft, non-tender, non-distended MUSCULOSKELETAL:  No edema; No deformity  SKIN: Warm and dry NEUROLOGIC:  Alert and oriented x 3 PSYCHIATRIC:  Normal affect   Signed, Jenean Lindau, MD  05/17/2021 4:25 PM    [facility]

## 2021-05-18 ENCOUNTER — Telehealth: Payer: Self-pay | Admitting: *Deleted

## 2021-05-18 LAB — CBC WITH DIFFERENTIAL/PLATELET
Basophils Absolute: 0.1 10*3/uL (ref 0.0–0.2)
Basos: 1 %
EOS (ABSOLUTE): 0.5 10*3/uL — ABNORMAL HIGH (ref 0.0–0.4)
Eos: 7 %
Hematocrit: 45.1 % (ref 37.5–51.0)
Hemoglobin: 14.5 g/dL (ref 13.0–17.7)
Immature Grans (Abs): 0 10*3/uL (ref 0.0–0.1)
Immature Granulocytes: 0 %
Lymphocytes Absolute: 1.3 10*3/uL (ref 0.7–3.1)
Lymphs: 19 %
MCH: 26.3 pg — ABNORMAL LOW (ref 26.6–33.0)
MCHC: 32.2 g/dL (ref 31.5–35.7)
MCV: 82 fL (ref 79–97)
Monocytes Absolute: 0.6 10*3/uL (ref 0.1–0.9)
Monocytes: 9 %
Neutrophils Absolute: 4.5 10*3/uL (ref 1.4–7.0)
Neutrophils: 64 %
Platelets: 195 10*3/uL (ref 150–450)
RBC: 5.51 x10E6/uL (ref 4.14–5.80)
RDW: 14.5 % (ref 11.6–15.4)
WBC: 7 10*3/uL (ref 3.4–10.8)

## 2021-05-18 LAB — BASIC METABOLIC PANEL
BUN/Creatinine Ratio: 12 (ref 10–24)
BUN: 13 mg/dL (ref 8–27)
CO2: 25 mmol/L (ref 20–29)
Calcium: 9.1 mg/dL (ref 8.6–10.2)
Chloride: 97 mmol/L (ref 96–106)
Creatinine, Ser: 1.1 mg/dL (ref 0.76–1.27)
Glucose: 85 mg/dL (ref 65–99)
Potassium: 3.9 mmol/L (ref 3.5–5.2)
Sodium: 139 mmol/L (ref 134–144)
eGFR: 74 mL/min/{1.73_m2} (ref >59)

## 2021-05-18 NOTE — Telephone Encounter (Signed)
Pt contacted pre-catheterization scheduled at [facility] for: Monday May 22, 2021 9 AM Verified arrival time and place: [facility]) at: 7 AM   No solid food after midnight prior to cath, clear liquids until 5 AM day of procedure.  Hold: HCTZ-AM of procedure  Except hold medications AM meds can be  taken pre-cath with sips of water including: ASA 81 mg   Confirmed patient has responsible adult to drive home post procedure and be with patient first 24 hours after arriving home: yes  You are allowed ONE visitor in the waiting room during the time you are at the hospital for your procedure. Both you and your visitor must wear a mask once you enter the hospital.   Patient reports does not currently have any symptoms concerning for COVID-19 and no household members with COVID-19 like illness.        Reviewed procedure/mask/visitor instructions with patient.

## 2021-05-22 ENCOUNTER — Encounter (HOSPITAL_COMMUNITY)
Admission: RE | Disposition: A | Discharge status: Home / Self Care | Payer: Self-pay | Source: Home / Self Care | Attending: Cardiovascular Disease

## 2021-05-22 ENCOUNTER — Other Ambulatory Visit: Payer: Self-pay

## 2021-05-22 ENCOUNTER — Inpatient Hospital Stay (HOSPITAL_COMMUNITY)
Admission: RE | Admit: 2021-05-22 | Discharge: 2021-05-24 | DRG: 251 | Disposition: A | Discharge status: Home / Self Care | Payer: PPO | Attending: Cardiovascular Disease | Admitting: Cardiovascular Disease

## 2021-05-22 DIAGNOSIS — I25110 Atherosclerotic heart disease of native coronary artery with unstable angina pectoris: Secondary | ICD-10-CM | POA: Diagnosis present

## 2021-05-22 DIAGNOSIS — E782 Mixed hyperlipidemia: Secondary | ICD-10-CM | POA: Diagnosis present

## 2021-05-22 DIAGNOSIS — E785 Hyperlipidemia, unspecified: Secondary | ICD-10-CM | POA: Diagnosis present

## 2021-05-22 DIAGNOSIS — Z881 Allergy status to other antibiotic agents status: Secondary | ICD-10-CM | POA: Diagnosis not present

## 2021-05-22 DIAGNOSIS — K219 Gastro-esophageal reflux disease without esophagitis: Secondary | ICD-10-CM | POA: Diagnosis present

## 2021-05-22 DIAGNOSIS — Z20822 Contact with and (suspected) exposure to covid-19: Secondary | ICD-10-CM | POA: Diagnosis present

## 2021-05-22 DIAGNOSIS — Z955 Presence of coronary angioplasty implant and graft: Secondary | ICD-10-CM

## 2021-05-22 DIAGNOSIS — Z7901 Long term (current) use of anticoagulants: Secondary | ICD-10-CM | POA: Diagnosis not present

## 2021-05-22 DIAGNOSIS — M170 Bilateral primary osteoarthritis of knee: Secondary | ICD-10-CM | POA: Diagnosis present

## 2021-05-22 DIAGNOSIS — I200 Unstable angina: Secondary | ICD-10-CM | POA: Diagnosis present

## 2021-05-22 DIAGNOSIS — E876 Hypokalemia: Secondary | ICD-10-CM | POA: Diagnosis present

## 2021-05-22 DIAGNOSIS — Z9861 Coronary angioplasty status: Secondary | ICD-10-CM

## 2021-05-22 DIAGNOSIS — Z7951 Long term (current) use of inhaled steroids: Secondary | ICD-10-CM

## 2021-05-22 DIAGNOSIS — I2510 Atherosclerotic heart disease of native coronary artery without angina pectoris: Secondary | ICD-10-CM | POA: Diagnosis not present

## 2021-05-22 DIAGNOSIS — I10 Essential (primary) hypertension: Secondary | ICD-10-CM | POA: Diagnosis present

## 2021-05-22 DIAGNOSIS — Z888 Allergy status to other drugs, medicaments and biological substances status: Secondary | ICD-10-CM

## 2021-05-22 DIAGNOSIS — I252 Old myocardial infarction: Secondary | ICD-10-CM

## 2021-05-22 DIAGNOSIS — K2270 Barrett's esophagus without dysplasia: Secondary | ICD-10-CM | POA: Diagnosis present

## 2021-05-22 DIAGNOSIS — I2582 Chronic total occlusion of coronary artery: Secondary | ICD-10-CM | POA: Diagnosis present

## 2021-05-22 DIAGNOSIS — Z7982 Long term (current) use of aspirin: Secondary | ICD-10-CM

## 2021-05-22 DIAGNOSIS — T82855A Stenosis of coronary artery stent, initial encounter: Secondary | ICD-10-CM | POA: Diagnosis present

## 2021-05-22 DIAGNOSIS — Z87892 Personal history of anaphylaxis: Secondary | ICD-10-CM

## 2021-05-22 DIAGNOSIS — I209 Angina pectoris, unspecified: Secondary | ICD-10-CM

## 2021-05-22 DIAGNOSIS — J449 Chronic obstructive pulmonary disease, unspecified: Secondary | ICD-10-CM | POA: Diagnosis present

## 2021-05-22 DIAGNOSIS — Z79899 Other long term (current) drug therapy: Secondary | ICD-10-CM

## 2021-05-22 DIAGNOSIS — G4730 Sleep apnea, unspecified: Secondary | ICD-10-CM | POA: Diagnosis present

## 2021-05-22 HISTORY — PX: LEFT HEART CATH AND CORONARY ANGIOGRAPHY: CATH118249

## 2021-05-22 HISTORY — PX: CORONARY BALLOON ANGIOPLASTY: CATH118233

## 2021-05-22 HISTORY — DX: Unstable angina: I20.0

## 2021-05-22 LAB — POCT ACTIVATED CLOTTING TIME
Activated Clotting Time: 277 seconds
Activated Clotting Time: 329 seconds
Activated Clotting Time: 358 seconds
Activated Clotting Time: 295 seconds

## 2021-05-22 SURGERY — LEFT HEART CATH AND CORONARY ANGIOGRAPHY
Anesthesia: LOCAL

## 2021-05-22 MED ORDER — SODIUM CHLORIDE 0.9% FLUSH: 3.0000 mL | Freq: Two times a day (BID) | INTRAVENOUS | Status: DC

## 2021-05-22 MED ORDER — ASPIRIN 81 MG PO CHEW: 81.0000 mg | CHEWABLE_TABLET | ORAL | Status: DC

## 2021-05-22 MED ORDER — NITROGLYCERIN 1 MG/10 ML FOR IR/CATH LAB
INTRA_ARTERIAL | Status: AC
  Filled 2021-05-22: qty 10

## 2021-05-22 MED ORDER — MIDAZOLAM HCL 2 MG/2ML IJ SOLN
INTRAMUSCULAR | Status: DC | PRN
  Administered 2021-05-22: 2 mg via INTRAVENOUS
  Administered 2021-05-22 (×2): 1 mg via INTRAVENOUS

## 2021-05-22 MED ORDER — NITROGLYCERIN 1 MG/10 ML FOR IR/CATH LAB
INTRA_ARTERIAL | Status: DC | PRN
  Administered 2021-05-22 (×2): 200 ug via INTRACORONARY

## 2021-05-22 MED ORDER — VERAPAMIL HCL 2.5 MG/ML IV SOLN
INTRAVENOUS | Status: DC | PRN
  Administered 2021-05-22: 1 mg via INTRA_ARTERIAL

## 2021-05-22 MED ORDER — HYDRALAZINE HCL 20 MG/ML IJ SOLN
INTRAMUSCULAR | Status: AC
  Filled 2021-05-22: qty 1

## 2021-05-22 MED ORDER — FENTANYL CITRATE (PF) 100 MCG/2ML IJ SOLN
INTRAMUSCULAR | Status: DC | PRN
  Administered 2021-05-22 (×2): 25 ug via INTRAVENOUS
  Administered 2021-05-22: 50 ug via INTRAVENOUS

## 2021-05-22 MED ORDER — FENTANYL CITRATE (PF) 100 MCG/2ML IJ SOLN
INTRAMUSCULAR | Status: AC
  Filled 2021-05-22: qty 2

## 2021-05-22 MED ORDER — TICAGRELOR 90 MG PO TABS
ORAL_TABLET | ORAL | Status: DC | PRN
  Administered 2021-05-22: 180 mg via ORAL

## 2021-05-22 MED ORDER — ONDANSETRON HCL 4 MG/2ML IJ SOLN: 4.0000 mg | Freq: Four times a day (QID) | INTRAMUSCULAR | Status: DC | PRN

## 2021-05-22 MED ORDER — TICAGRELOR 90 MG PO TABS
90.0000 mg | ORAL_TABLET | Freq: Two times a day (BID) | ORAL | Status: DC
  Administered 2021-05-22 – 2021-05-23 (×2): 90 mg via ORAL
  Filled 2021-05-22 (×2): qty 1

## 2021-05-22 MED ORDER — LABETALOL HCL 5 MG/ML IV SOLN: 10.0000 mg | INTRAVENOUS | Status: AC | PRN

## 2021-05-22 MED ORDER — MIDAZOLAM HCL 2 MG/2ML IJ SOLN
INTRAMUSCULAR | Status: AC
  Filled 2021-05-22: qty 2

## 2021-05-22 MED ORDER — FLUTICASONE FUROATE-VILANTEROL 100-25 MCG/INH IN AEPB
1.0000 | INHALATION_SPRAY | Freq: Every day | RESPIRATORY_TRACT | Status: DC
  Administered 2021-05-24: 1 via RESPIRATORY_TRACT
  Filled 2021-05-22: qty 28

## 2021-05-22 MED ORDER — ASPIRIN EC 81 MG PO TBEC
81.0000 mg | DELAYED_RELEASE_TABLET | Freq: Every evening | ORAL | Status: DC
  Administered 2021-05-23: 81 mg via ORAL
  Filled 2021-05-22: qty 1

## 2021-05-22 MED ORDER — VERAPAMIL HCL 2.5 MG/ML IV SOLN
INTRAVENOUS | Status: AC
  Filled 2021-05-22: qty 2

## 2021-05-22 MED ORDER — SODIUM CHLORIDE 0.9% FLUSH
3.0000 mL | Freq: Two times a day (BID) | INTRAVENOUS | Status: DC
  Administered 2021-05-22 – 2021-05-24 (×3): 3 mL via INTRAVENOUS

## 2021-05-22 MED ORDER — MIRTAZAPINE 15 MG PO TABS
15.0000 mg | ORAL_TABLET | Freq: Every day | ORAL | Status: DC
  Administered 2021-05-22 – 2021-05-23 (×2): 15 mg via ORAL
  Filled 2021-05-22 (×2): qty 1

## 2021-05-22 MED ORDER — HEPARIN SODIUM (PORCINE) 1000 UNIT/ML IJ SOLN
INTRAMUSCULAR | Status: AC
  Filled 2021-05-22: qty 1

## 2021-05-22 MED ORDER — HEPARIN (PORCINE) 25000 UT/250ML-% IV SOLN
1400.0000 [IU]/h | INTRAVENOUS | Status: DC
  Administered 2021-05-22: 1200 [IU]/h via INTRAVENOUS
  Filled 2021-05-22: qty 250

## 2021-05-22 MED ORDER — NITROGLYCERIN IN D5W 200-5 MCG/ML-% IV SOLN
5.0000 ug/min | INTRAVENOUS | Status: DC
  Administered 2021-05-22: 10 ug/min via INTRAVENOUS

## 2021-05-22 MED ORDER — HYDRALAZINE HCL 20 MG/ML IJ SOLN
10.0000 mg | INTRAMUSCULAR | Status: AC | PRN
  Administered 2021-05-22: 10 mg via INTRAVENOUS

## 2021-05-22 MED ORDER — UMECLIDINIUM BROMIDE 62.5 MCG/INH IN AEPB
1.0000 | INHALATION_SPRAY | Freq: Every day | RESPIRATORY_TRACT | Status: DC
  Filled 2021-05-22: qty 7

## 2021-05-22 MED ORDER — PANTOPRAZOLE SODIUM 40 MG PO TBEC
40.0000 mg | DELAYED_RELEASE_TABLET | Freq: Every morning | ORAL | Status: DC
  Administered 2021-05-24: 40 mg via ORAL
  Filled 2021-05-22: qty 1

## 2021-05-22 MED ORDER — NITROGLYCERIN 0.4 MG SL SUBL: 0.4000 mg | SUBLINGUAL_TABLET | SUBLINGUAL | Status: DC | PRN

## 2021-05-22 MED ORDER — SODIUM CHLORIDE 0.9% FLUSH: 3.0000 mL | INTRAVENOUS | Status: DC | PRN

## 2021-05-22 MED ORDER — ALBUTEROL SULFATE (2.5 MG/3ML) 0.083% IN NEBU
2.5000 mg | INHALATION_SOLUTION | RESPIRATORY_TRACT | Status: DC | PRN
  Administered 2021-05-22 – 2021-05-23 (×2): 2.5 mg via RESPIRATORY_TRACT
  Filled 2021-05-22 (×2): qty 3

## 2021-05-22 MED ORDER — ACETAMINOPHEN 325 MG PO TABS: 650.0000 mg | ORAL_TABLET | ORAL | Status: DC | PRN

## 2021-05-22 MED ORDER — ROSUVASTATIN CALCIUM 20 MG PO TABS
40.0000 mg | ORAL_TABLET | Freq: Every evening | ORAL | Status: DC
  Administered 2021-05-22 – 2021-05-23 (×2): 40 mg via ORAL
  Filled 2021-05-22 (×2): qty 2

## 2021-05-22 MED ORDER — SODIUM CHLORIDE 0.9 % IV SOLN: INTRAVENOUS | Status: AC

## 2021-05-22 MED ORDER — TIOTROPIUM BROMIDE MONOHYDRATE 1.25 MCG/ACT IN AERS: 2.0000 | INHALATION_SPRAY | Freq: Every morning | RESPIRATORY_TRACT | Status: DC

## 2021-05-22 MED ORDER — VERAPAMIL HCL 2.5 MG/ML IV SOLN
INTRAVENOUS | Status: DC | PRN
  Administered 2021-05-22: 10 mL via INTRA_ARTERIAL

## 2021-05-22 MED ORDER — SODIUM CHLORIDE 0.9 % IV SOLN: 250.0000 mL | INTRAVENOUS | Status: DC | PRN

## 2021-05-22 MED ORDER — HEPARIN (PORCINE) IN NACL 1000-0.9 UT/500ML-% IV SOLN
INTRAVENOUS | Status: DC | PRN
  Administered 2021-05-22 (×2): 500 mL

## 2021-05-22 MED ORDER — HEPARIN SODIUM (PORCINE) 1000 UNIT/ML IJ SOLN
INTRAMUSCULAR | Status: DC | PRN
  Administered 2021-05-22: 5000 [IU] via INTRAVENOUS
  Administered 2021-05-22: 6000 [IU] via INTRAVENOUS
  Administered 2021-05-22 (×2): 2000 [IU] via INTRAVENOUS

## 2021-05-22 MED ORDER — NITROGLYCERIN IN D5W 200-5 MCG/ML-% IV SOLN
INTRAVENOUS | Status: AC
  Filled 2021-05-22: qty 250

## 2021-05-22 MED ORDER — OXYCODONE HCL 5 MG PO TABS
10.0000 mg | ORAL_TABLET | Freq: Once | ORAL | Status: AC
  Filled 2021-05-22: qty 2

## 2021-05-22 MED ORDER — METOPROLOL SUCCINATE ER 100 MG PO TB24
100.0000 mg | ORAL_TABLET | Freq: Every day | ORAL | Status: DC
  Administered 2021-05-22 – 2021-05-24 (×3): 100 mg via ORAL
  Filled 2021-05-22 (×3): qty 1

## 2021-05-22 MED ORDER — MONTELUKAST SODIUM 10 MG PO TABS: 10.0000 mg | ORAL_TABLET | Freq: Every day | ORAL | Status: DC | PRN

## 2021-05-22 MED ORDER — IOHEXOL 350 MG/ML SOLN
INTRAVENOUS | Status: DC | PRN
  Administered 2021-05-22: 180 mL via INTRA_ARTERIAL

## 2021-05-22 MED ORDER — SODIUM CHLORIDE 0.9 % WEIGHT BASED INFUSION: 1.0000 mL/kg/h | INTRAVENOUS | Status: DC

## 2021-05-22 MED ORDER — LIDOCAINE HCL (PF) 1 % IJ SOLN
INTRAMUSCULAR | Status: AC
  Filled 2021-05-22: qty 30

## 2021-05-22 MED ORDER — ISOSORBIDE MONONITRATE ER 30 MG PO TB24
30.0000 mg | ORAL_TABLET | Freq: Every day | ORAL | Status: DC
  Administered 2021-05-22 – 2021-05-24 (×3): 30 mg via ORAL
  Filled 2021-05-22 (×3): qty 1

## 2021-05-22 MED ORDER — LIDOCAINE HCL (PF) 1 % IJ SOLN
INTRAMUSCULAR | Status: DC | PRN
  Administered 2021-05-22: 2 mL

## 2021-05-22 MED ORDER — SODIUM CHLORIDE 0.9 % WEIGHT BASED INFUSION
3.0000 mL/kg/h | INTRAVENOUS | Status: DC
  Administered 2021-05-22: 3 mL/kg/h via INTRAVENOUS

## 2021-05-22 MED ORDER — TICAGRELOR 90 MG PO TABS
ORAL_TABLET | ORAL | Status: AC
  Filled 2021-05-22: qty 2

## 2021-05-22 MED ORDER — OXYCODONE HCL 5 MG PO TABS
10.0000 mg | ORAL_TABLET | Freq: Four times a day (QID) | ORAL | Status: DC | PRN
  Administered 2021-05-22 – 2021-05-24 (×2): 10 mg via ORAL
  Filled 2021-05-22 (×2): qty 2

## 2021-05-22 MED ORDER — DIAZEPAM 5 MG PO TABS
5.0000 mg | ORAL_TABLET | ORAL | Status: DC | PRN
  Administered 2021-05-23 – 2021-05-24 (×3): 5 mg via ORAL
  Filled 2021-05-22 (×3): qty 1

## 2021-05-22 MED ORDER — HEPARIN (PORCINE) IN NACL 1000-0.9 UT/500ML-% IV SOLN
INTRAVENOUS | Status: AC
  Filled 2021-05-22: qty 1000

## 2021-05-22 SURGICAL SUPPLY — 19 items
BALLN SAPPHIRE 2.5X15 (BALLOONS) ×2
BALLOON SAPPHIRE 2.5X15 (BALLOONS) ×1 IMPLANT
CATH OPTITORQUE TIG 4.0 5F (CATHETERS) ×2 IMPLANT
CATH VISTA GUIDE 6FR JR4 (CATHETERS) ×2 IMPLANT
CATH VISTA GUIDE 6FR XBRCA (CATHETERS) ×4 IMPLANT
DEVICE RAD COMP TR BAND LRG (VASCULAR PRODUCTS) ×2 IMPLANT
GLIDESHEATH SLEND SS 6F .021 (SHEATH) ×2 IMPLANT
GUIDEWIRE INQWIRE 1.5J.035X260 (WIRE) ×1 IMPLANT
INQWIRE 1.5J .035X260CM (WIRE) ×2
KIT ENCORE 26 ADVANTAGE (KITS) ×2 IMPLANT
KIT HEART LEFT (KITS) ×2 IMPLANT
PACK CARDIAC CATHETERIZATION (CUSTOM PROCEDURE TRAY) ×2 IMPLANT
SHEATH PROBE COVER 6X72 (BAG) ×2 IMPLANT
SYR MEDRAD MARK 7 150ML (SYRINGE) ×2 IMPLANT
TRANSDUCER W/STOPCOCK (MISCELLANEOUS) ×2 IMPLANT
TUBING CIL FLEX 10 FLL-RA (TUBING) ×2 IMPLANT
WIRE COUGAR XT STRL 190CM (WIRE) ×2 IMPLANT
WIRE HI TORQ WHISPER MS 190CM (WIRE) ×4 IMPLANT
WIRE PT2 MS 185 (WIRE) ×4 IMPLANT

## 2021-05-22 NOTE — Progress Notes (Signed)
Patient c/o shortness of breath. Bilateral breath sounds are clear with diminished bases. Patient has h/o COPD. Arrived to cath lab holding area on room air. O2 sat 99- 100%. BP 214/91; hydralazine 10mg  IV given.  Assisted patient out of bed to recliner. Patient states shortness of breath "comes and goes." O2 applied @ 2L/min for patient comfort. Dr. Claiborne Billings notified; nitroglycerin gtt intiated per orders. Will continue to monitor.Cindee Salt

## 2021-05-22 NOTE — Progress Notes (Signed)
Per Dr Renella Cunas wait to resume Heparin until TR Band is off

## 2021-05-22 NOTE — Progress Notes (Signed)
[place] for heparin Indication: chest pain/ACS  Allergies  Allergen Reactions   Ceftriaxone Anaphylaxis    Rocephin    Cefdinir Rash   Ceftin [Cefuroxime] Rash   Ciprofloxacin Rash    Patient Measurements: Height: 6' (182.9 cm) Weight: 98.9 kg (218 lb) IBW/kg (Calculated) : 77.6 Heparin Dosing Weight: 98kg  Vital Signs: Temp: 97.6 F (36.4 C) (06/13 1414) Temp Source: Oral (06/13 1414) BP: 159/78 (06/13 1414) Pulse Rate: 71 (06/13 1414)  Labs: No results for input(s): HGB, HCT, PLT, APTT, LABPROT, INR, HEPARINUNFRC, HEPRLOWMOCWT, CREATININE, CKTOTAL, CKMB, TROPONINIHS in the last 72 hours.  Estimated Creatinine Clearance: 80.4 mL/min (by C-G formula based on SCr of 1.1 mg/dL).   Medical History: Past Medical History:  Diagnosis Date   Angina pectoris ([place])    Arthritis    Asthma    Barrett esophagus    Bilateral primary osteoarthritis of knee 05/16/2021   Bronchiectasis ([place])    CAD (coronary artery disease)    a. prior mild RCAx2 and LAD stenting. b. same day PCI DES to dRCA 12/2017, 100% occ oD1 with L-L collaterals, otherwise nonobstructive disease, EF normal.   Chronic obstructive pulmonary disease ([place]) 12/17/2016   Formatting of this note might be different from the original. PFTs 01/30/17 moderate obstruction Ratio 69%, FEV1 76%, FVC 85%   COPD (chronic obstructive pulmonary disease) ([facility])    Coronary artery disease    Essential hypertension 01/17/2018   GERD (gastroesophageal reflux disease)    Hiatal hernia    Hyperlipemia    Hypertension    Mixed dyslipidemia 01/17/2018   Myocardial infarct ([facility])    Sleep apnea    SOB (shortness of breath)     Assessment: 56 yoM admitted for elective heart cath. Plans for staged PCI tomorrow, pharmacy to begin IV heparin overnight 8h after sheath removal (~1200). No AC PTA.  Goal of Therapy:  Heparin level 0.3-0.7 units/ml Monitor platelets by anticoagulation protocol: Yes    Plan:  Heparin 1200 units/h no bolus at 2000 Check 8h heparin level   Arrie Senate, PharmD, [place], [facility] Clinical Pharmacist [phone] Please check AMION for all [facility] Pharm[facility] numbers 05/22/2021

## 2021-05-22 NOTE — Interval H&P Note (Signed)
Cath Lab Visit (complete for each Cath Lab visit)  Clinical Evaluation Leading to the Procedure:   ACS: No.  Non-ACS:    Anginal Classification: CCS II  Anti-ischemic medical therapy: Minimal Therapy (1 class of medications)  Non-Invasive Test Results: No non-invasive testing performed  Prior CABG: No previous CABG      History and Physical Interval Note:  05/22/2021 9:04 AM  Nicholas Love  has presented today for surgery, with the diagnosis of angina.  The various methods of treatment have been discussed with the patient and family. After consideration of risks, benefits and other options for treatment, the patient has consented to  Procedure(s): LEFT HEART CATH AND CORONARY ANGIOGRAPHY (N/A) as a surgical intervention.  The patient's history has been reviewed, patient examined, no change in status, stable for surgery.  I have reviewed the patient's chart and labs.  Questions were answered to the patient's satisfaction.     Shelva Majestic

## 2021-05-22 NOTE — Care Management (Signed)
05-22-21 Benefits check submitted for Brilinta. Case Manager will follow for cost.

## 2021-05-23 ENCOUNTER — Inpatient Hospital Stay (HOSPITAL_COMMUNITY)
Admission: RE | Disposition: A | Discharge status: Home / Self Care | Payer: Self-pay | Source: Home / Self Care | Attending: Cardiovascular Disease

## 2021-05-23 ENCOUNTER — Ambulatory Visit (HOSPITAL_COMMUNITY): Admission: RE | Admit: 2021-05-23 | Payer: PPO | Source: Home / Self Care | Admitting: Cardiovascular Disease

## 2021-05-23 ENCOUNTER — Encounter (HOSPITAL_COMMUNITY): Payer: Self-pay | Admitting: Cardiovascular Disease

## 2021-05-23 DIAGNOSIS — J449 Chronic obstructive pulmonary disease, unspecified: Secondary | ICD-10-CM | POA: Diagnosis not present

## 2021-05-23 DIAGNOSIS — K219 Gastro-esophageal reflux disease without esophagitis: Secondary | ICD-10-CM | POA: Diagnosis not present

## 2021-05-23 DIAGNOSIS — Z888 Allergy status to other drugs, medicaments and biological substances status: Secondary | ICD-10-CM | POA: Diagnosis not present

## 2021-05-23 DIAGNOSIS — E782 Mixed hyperlipidemia: Secondary | ICD-10-CM | POA: Diagnosis not present

## 2021-05-23 DIAGNOSIS — I25110 Atherosclerotic heart disease of native coronary artery with unstable angina pectoris: Secondary | ICD-10-CM | POA: Diagnosis not present

## 2021-05-23 DIAGNOSIS — T82855A Stenosis of coronary artery stent, initial encounter: Secondary | ICD-10-CM | POA: Diagnosis not present

## 2021-05-23 DIAGNOSIS — I10 Essential (primary) hypertension: Secondary | ICD-10-CM

## 2021-05-23 DIAGNOSIS — I2510 Atherosclerotic heart disease of native coronary artery without angina pectoris: Secondary | ICD-10-CM | POA: Diagnosis not present

## 2021-05-23 DIAGNOSIS — Z79899 Other long term (current) drug therapy: Secondary | ICD-10-CM | POA: Diagnosis not present

## 2021-05-23 DIAGNOSIS — E876 Hypokalemia: Secondary | ICD-10-CM | POA: Diagnosis not present

## 2021-05-23 DIAGNOSIS — Z87892 Personal history of anaphylaxis: Secondary | ICD-10-CM | POA: Diagnosis not present

## 2021-05-23 DIAGNOSIS — Z955 Presence of coronary angioplasty implant and graft: Secondary | ICD-10-CM | POA: Diagnosis not present

## 2021-05-23 DIAGNOSIS — I200 Unstable angina: Secondary | ICD-10-CM

## 2021-05-23 DIAGNOSIS — Z7982 Long term (current) use of aspirin: Secondary | ICD-10-CM | POA: Diagnosis not present

## 2021-05-23 DIAGNOSIS — Z881 Allergy status to other antibiotic agents status: Secondary | ICD-10-CM | POA: Diagnosis not present

## 2021-05-23 DIAGNOSIS — Z7951 Long term (current) use of inhaled steroids: Secondary | ICD-10-CM | POA: Diagnosis not present

## 2021-05-23 DIAGNOSIS — G4730 Sleep apnea, unspecified: Secondary | ICD-10-CM | POA: Diagnosis not present

## 2021-05-23 DIAGNOSIS — Z7901 Long term (current) use of anticoagulants: Secondary | ICD-10-CM | POA: Diagnosis not present

## 2021-05-23 DIAGNOSIS — Z20822 Contact with and (suspected) exposure to covid-19: Secondary | ICD-10-CM | POA: Diagnosis not present

## 2021-05-23 DIAGNOSIS — I252 Old myocardial infarction: Secondary | ICD-10-CM | POA: Diagnosis not present

## 2021-05-23 DIAGNOSIS — I2582 Chronic total occlusion of coronary artery: Secondary | ICD-10-CM | POA: Diagnosis not present

## 2021-05-23 DIAGNOSIS — K2270 Barrett's esophagus without dysplasia: Secondary | ICD-10-CM | POA: Diagnosis not present

## 2021-05-23 DIAGNOSIS — M170 Bilateral primary osteoarthritis of knee: Secondary | ICD-10-CM | POA: Diagnosis not present

## 2021-05-23 HISTORY — PX: CORONARY BALLOON ANGIOPLASTY: CATH118233

## 2021-05-23 LAB — BASIC METABOLIC PANEL
Anion gap: 9 (ref 5–15)
BUN: 9 mg/dL (ref 8–23)
CO2: 27 mmol/L (ref 22–32)
Calcium: 8.7 mg/dL — ABNORMAL LOW (ref 8.9–10.3)
Chloride: 103 mmol/L (ref 98–111)
Creatinine, Ser: 1.01 mg/dL (ref 0.61–1.24)
GFR, Estimated: >60 mL/min (ref >60)
Glucose, Bld: 105 mg/dL — ABNORMAL HIGH (ref 70–99)
Potassium: 3.4 mmol/L — ABNORMAL LOW (ref 3.5–5.1)
Sodium: 139 mmol/L (ref 135–145)

## 2021-05-23 LAB — CBC
HCT: 38.1 % — ABNORMAL LOW (ref 39.0–52.0)
Hemoglobin: 12.3 g/dL — ABNORMAL LOW (ref 13.0–17.0)
MCH: 26.5 pg (ref 26.0–34.0)
MCHC: 32.3 g/dL (ref 30.0–36.0)
MCV: 82.1 fL (ref 80.0–100.0)
Platelets: 152 10*3/uL (ref 150–400)
RBC: 4.64 MIL/uL (ref 4.22–5.81)
RDW: 14.3 % (ref 11.5–15.5)
WBC: 8.9 10*3/uL (ref 4.0–10.5)
nRBC: 0 % (ref 0.0–0.2)

## 2021-05-23 LAB — HEPARIN LEVEL (UNFRACTIONATED)
Heparin Unfractionated: 0.17 IU/mL — ABNORMAL LOW (ref 0.30–0.70)
Heparin Unfractionated: 0.16 IU/mL — ABNORMAL LOW (ref 0.30–0.70)

## 2021-05-23 LAB — POCT ACTIVATED CLOTTING TIME: Activated Clotting Time: 572 seconds

## 2021-05-23 LAB — SARS CORONAVIRUS 2 BY RT PCR (HOSPITAL ORDER, PERFORMED IN ~~LOC~~ HOSPITAL LAB): SARS Coronavirus 2: NEGATIVE

## 2021-05-23 SURGERY — CORONARY BALLOON ANGIOPLASTY
Anesthesia: LOCAL

## 2021-05-23 MED ORDER — POTASSIUM CHLORIDE CRYS ER 20 MEQ PO TBCR
20.0000 meq | EXTENDED_RELEASE_TABLET | Freq: Once | ORAL | Status: AC
  Administered 2021-05-23: 20 meq via ORAL
  Filled 2021-05-23: qty 1

## 2021-05-23 MED ORDER — SODIUM CHLORIDE 0.9% FLUSH
3.0000 mL | Freq: Two times a day (BID) | INTRAVENOUS | Status: DC
  Administered 2021-05-24: 3 mL via INTRAVENOUS

## 2021-05-23 MED ORDER — SODIUM CHLORIDE 0.9% FLUSH: 3.0000 mL | Freq: Two times a day (BID) | INTRAVENOUS | Status: DC

## 2021-05-23 MED ORDER — SODIUM CHLORIDE 0.9% FLUSH: 3.0000 mL | INTRAVENOUS | Status: DC | PRN

## 2021-05-23 MED ORDER — SODIUM CHLORIDE 0.9 % WEIGHT BASED INFUSION
1.0000 mL/kg/h | INTRAVENOUS | Status: DC
  Administered 2021-05-23: 1 mL/kg/h via INTRAVENOUS

## 2021-05-23 MED ORDER — PNEUMOCOCCAL VAC POLYVALENT 25 MCG/0.5ML IJ INJ
0.5000 mL | INJECTION | INTRAMUSCULAR | Status: DC
  Filled 2021-05-23: qty 0.5

## 2021-05-23 MED ORDER — HYDRALAZINE HCL 20 MG/ML IJ SOLN
10.0000 mg | INTRAMUSCULAR | Status: AC | PRN
  Administered 2021-05-23: 10 mg via INTRAVENOUS
  Filled 2021-05-23: qty 1

## 2021-05-23 MED ORDER — FENTANYL CITRATE (PF) 100 MCG/2ML IJ SOLN
INTRAMUSCULAR | Status: DC | PRN
  Administered 2021-05-23: 50 ug via INTRAVENOUS
  Administered 2021-05-23 (×2): 25 ug via INTRAVENOUS

## 2021-05-23 MED ORDER — SODIUM CHLORIDE 0.9 % IV SOLN: INTRAVENOUS | Status: DC

## 2021-05-23 MED ORDER — LIDOCAINE HCL (PF) 1 % IJ SOLN
INTRAMUSCULAR | Status: DC | PRN
  Administered 2021-05-23: 20 mL via INTRADERMAL

## 2021-05-23 MED ORDER — MORPHINE SULFATE (PF) 2 MG/ML IV SOLN
2.0000 mg | INTRAVENOUS | Status: DC | PRN
Start: 2021-05-23 — End: 2021-05-24
  Administered 2021-05-23: 2 mg via INTRAVENOUS
  Filled 2021-05-23: qty 1

## 2021-05-23 MED ORDER — SODIUM CHLORIDE 0.9 % IV SOLN: 250.0000 mL | INTRAVENOUS | Status: DC | PRN

## 2021-05-23 MED ORDER — HEPARIN (PORCINE) IN NACL 1000-0.9 UT/500ML-% IV SOLN
INTRAVENOUS | Status: DC | PRN
  Administered 2021-05-23 (×2): 500 mL

## 2021-05-23 MED ORDER — NITROGLYCERIN 1 MG/10 ML FOR IR/CATH LAB
INTRA_ARTERIAL | Status: AC
  Filled 2021-05-23: qty 10

## 2021-05-23 MED ORDER — LIDOCAINE HCL (PF) 1 % IJ SOLN
INTRAMUSCULAR | Status: AC
  Filled 2021-05-23: qty 30

## 2021-05-23 MED ORDER — FENTANYL CITRATE (PF) 100 MCG/2ML IJ SOLN
INTRAMUSCULAR | Status: AC
  Filled 2021-05-23: qty 2

## 2021-05-23 MED ORDER — MIDAZOLAM HCL 2 MG/2ML IJ SOLN
INTRAMUSCULAR | Status: AC
  Filled 2021-05-23: qty 2

## 2021-05-23 MED ORDER — BIVALIRUDIN TRIFLUOROACETATE 250 MG IV SOLR
INTRAVENOUS | Status: AC
  Filled 2021-05-23: qty 250

## 2021-05-23 MED ORDER — BIVALIRUDIN BOLUS VIA INFUSION - CUPID
INTRAVENOUS | Status: DC | PRN
  Administered 2021-05-23: 74.175 mg via INTRAVENOUS

## 2021-05-23 MED ORDER — ASPIRIN 81 MG PO CHEW
81.0000 mg | CHEWABLE_TABLET | ORAL | Status: AC
  Administered 2021-05-23: 81 mg via ORAL
  Filled 2021-05-23: qty 1

## 2021-05-23 MED ORDER — IOHEXOL 350 MG/ML SOLN
INTRAVENOUS | Status: DC | PRN
  Administered 2021-05-23: 120 mL via INTRA_ARTERIAL

## 2021-05-23 MED ORDER — HYDRALAZINE HCL 25 MG PO TABS: 25.0000 mg | ORAL_TABLET | Freq: Four times a day (QID) | ORAL | Status: DC | PRN

## 2021-05-23 MED ORDER — LABETALOL HCL 5 MG/ML IV SOLN: 10.0000 mg | INTRAVENOUS | Status: AC | PRN

## 2021-05-23 MED ORDER — SODIUM CHLORIDE 0.9 % IV SOLN
INTRAVENOUS | Status: AC | PRN
  Administered 2021-05-23 (×2): 1.75 mg/kg/h via INTRAVENOUS

## 2021-05-23 MED ORDER — MIDAZOLAM HCL 2 MG/2ML IJ SOLN
INTRAMUSCULAR | Status: DC | PRN
  Administered 2021-05-23 (×2): 1 mg via INTRAVENOUS
  Administered 2021-05-23: 2 mg via INTRAVENOUS

## 2021-05-23 MED ORDER — POTASSIUM CHLORIDE CRYS ER 20 MEQ PO TBCR
40.0000 meq | EXTENDED_RELEASE_TABLET | Freq: Once | ORAL | Status: AC
  Administered 2021-05-23: 40 meq via ORAL
  Filled 2021-05-23: qty 2

## 2021-05-23 MED ORDER — NITROGLYCERIN 1 MG/10 ML FOR IR/CATH LAB
INTRA_ARTERIAL | Status: DC | PRN
  Administered 2021-05-23: 200 ug via INTRACORONARY
  Administered 2021-05-23: 100 ug via INTRACORONARY
  Administered 2021-05-23 (×4): 200 ug via INTRACORONARY

## 2021-05-23 MED ORDER — SODIUM CHLORIDE 0.9 % WEIGHT BASED INFUSION
3.0000 mL/kg/h | INTRAVENOUS | Status: DC
  Administered 2021-05-23: 3 mL/kg/h via INTRAVENOUS

## 2021-05-23 MED ORDER — ONDANSETRON HCL 4 MG/2ML IJ SOLN: 4.0000 mg | Freq: Four times a day (QID) | INTRAMUSCULAR | Status: DC | PRN

## 2021-05-23 SURGICAL SUPPLY — 15 items
BALLN SAPPHIRE ~~LOC~~ 3.5X15 (BALLOONS) ×2 IMPLANT
BALLN SCOREFLEX 2.50X10 (BALLOONS) ×2
BALLOON SCOREFLEX 2.50X10 (BALLOONS) ×1 IMPLANT
CATH TELEPORT (CATHETERS) ×2 IMPLANT
CATH VISTA GUIDE 6FR XBRCA (CATHETERS) ×2 IMPLANT
DEVICE TORQUE .014-.018 (MISCELLANEOUS) ×1 IMPLANT
ELECT DEFIB PAD ADLT CADENCE (PAD) ×2 IMPLANT
KIT HEART LEFT (KITS) ×2 IMPLANT
PACK CARDIAC CATHETERIZATION (CUSTOM PROCEDURE TRAY) ×2 IMPLANT
SHEATH PINNACLE 6F 10CM (SHEATH) ×2 IMPLANT
TORQUE DEVICE .014-.018 (MISCELLANEOUS) ×2
TRANSDUCER W/STOPCOCK (MISCELLANEOUS) ×2 IMPLANT
TUBING CIL FLEX 10 FLL-RA (TUBING) ×2 IMPLANT
WIRE ASAHI PROWATER 300CM (WIRE) ×2 IMPLANT
WIRE EMERALD 3MM-J .035X150CM (WIRE) ×2 IMPLANT

## 2021-05-23 NOTE — Interval H&P Note (Signed)
Cath Lab Visit (complete for each Cath Lab visit)  Clinical Evaluation Leading to the Procedure:   ACS: No.  Non-ACS:    Anginal Classification: CCS III  Anti-ischemic medical therapy: Maximal Therapy (2 or more classes of medications)  Non-Invasive Test Results: No non-invasive testing performed  Prior CABG: No previous CABG      History and Physical Interval Note:  05/23/2021 10:54 AM  Nicholas Love  has presented today for surgery, with the diagnosis of cad.  The various methods of treatment have been discussed with the patient and family. After consideration of risks, benefits and other options for treatment, the patient has consented to  Procedure(s): CORONARY STENT INTERVENTION (N/A) as a surgical intervention.  The patient's history has been reviewed, patient examined, no change in status, stable for surgery.  I have reviewed the patient's chart and labs.  Questions were answered to the patient's satisfaction.     Shelva Majestic

## 2021-05-23 NOTE — Progress Notes (Signed)
86fr sheath removed from right femoral artery at 1603 hrs manual pressure used and hemostasis achieved at 1623 hrs.  Tegaderm and gauze dressing applied.  Site reviewed with 6e RN Yoko.  [employer] RT-R

## 2021-05-23 NOTE — Progress Notes (Signed)
ANTICOAGULATION CONSULT NOTE  Pharmacy Consult for heparin Indication: chest pain/ACS  Allergies  Allergen Reactions   Ceftriaxone Anaphylaxis    Rocephin    Cefdinir Rash   Ceftin [Cefuroxime] Rash   Ciprofloxacin Rash    Patient Measurements: Height: 6' (182.9 cm) Weight: 98.9 kg (218 lb) IBW/kg (Calculated) : 77.6 Heparin Dosing Weight: 98kg  Vital Signs: Temp: 98.4 F (36.9 C) (06/14 0045) Temp Source: Oral (06/14 0045) BP: 171/85 (06/14 0810) Pulse Rate: 77 (06/14 0810)  Labs: Recent Labs    05/23/21 0419 05/23/21 0750  HGB 12.3*  --   HCT 38.1*  --   PLT 152  --   HEPARINUNFRC 0.17* 0.16*  CREATININE 1.01  --     Estimated Creatinine Clearance: 87.6 mL/min (by C-G formula based on SCr of 1.01 mg/dL).   Medical History: Past Medical History:  Diagnosis Date   Angina pectoris ([place])    Arthritis    Asthma    Barrett esophagus    Bilateral primary osteoarthritis of knee 05/16/2021   Bronchiectasis ([place])    CAD (coronary artery disease)    a. prior mild RCAx2 and LAD stenting. b. same day PCI DES to dRCA 12/2017, 100% occ oD1 with L-L collaterals, otherwise nonobstructive disease, EF normal.   Chronic obstructive pulmonary disease ([place]) 12/17/2016   Formatting of this note might be different from the original. PFTs 01/30/17 moderate obstruction Ratio 69%, FEV1 76%, FVC 85%   COPD (chronic obstructive pulmonary disease) ([facility])    Coronary artery disease    Essential hypertension 01/17/2018   GERD (gastroesophageal reflux disease)    Hiatal hernia    Hyperlipemia    Hypertension    Mixed dyslipidemia 01/17/2018   Myocardial infarct ([facility])    Sleep apnea    SOB (shortness of breath)     Assessment: 28 yoM admitted for elective heart cath. Plans for staged PCI tomorrow, pharmacy to begin IV heparin overnight 8h after sheath removal (~1200). No AC PTA.  Heparin level this morning remains subtherapeutic at 0.16. Planning staged PCI later.  Goal of Therapy:   Heparin level 0.3-0.7 units/ml Monitor platelets by anticoagulation protocol: Yes   Plan:  Increase heparin to 1400 units/h F/U after cath   Arrie Senate, PharmD, BCPS, [facility] Clinical Pharmacist [phone] Please check AMION for all [facility] Pharmacy numbers 05/23/2021

## 2021-05-23 NOTE — H&P (View-Only) (Signed)
Progress Note  Patient Name: Nicholas Love Date of Encounter: 05/23/2021  [facility] Cardiologist: Dr. Geraldo Pitter  Subjective   Mild intermittent shortness of breath on Brilinta.  No recurrent chest pain.  Inpatient Medications    Scheduled Meds:  aspirin EC  81 mg Oral QPM   fluticasone furoate-vilanterol  1 puff Inhalation Daily   isosorbide mononitrate  30 mg Oral Daily   metoprolol succinate  100 mg Oral Daily   mirtazapine  15 mg Oral QHS   [START ON 05/24/2021] pantoprazole  40 mg Oral q AM   potassium chloride  20 mEq Oral Once   rosuvastatin  40 mg Oral QPM   sodium chloride flush  3 mL Intravenous Q12H   sodium chloride flush  3 mL Intravenous Q12H   ticagrelor  90 mg Oral BID   umeclidinium bromide  1 puff Inhalation Daily   Continuous Infusions:  sodium chloride     sodium chloride     sodium chloride 1 mL/kg/hr (05/23/21 0737)   heparin 1,200 Units/hr (05/23/21 0303)   PRN Meds: sodium chloride, sodium chloride, acetaminophen, albuterol, diazepam, montelukast, nitroGLYCERIN, ondansetron (ZOFRAN) IV, oxyCODONE, sodium chloride flush, sodium chloride flush   Vital Signs    Vitals:   05/23/21 0430 05/23/21 0431 05/23/21 0432 05/23/21 0810  BP: (!) 166/78   (!) 171/85  Pulse: 74 79 78 77  Resp:      Temp:      TempSrc:      SpO2: 96% 97% 97%   Weight:      Height:        Intake/Output Summary (Last 24 hours) at 05/23/2021 0834 Last data filed at 05/23/2021 0700 Gross per 24 hour  Intake 1569.65 ml  Output 2150 ml  Net -580.35 ml   Last 3 Weights 05/22/2021 05/17/2021 04/25/2021  Weight (lbs) 218 lb 218 lb 9.6 oz 226 lb  Weight (kg) 98.884 kg 99.156 kg 102.513 kg      Telemetry    Sinus rhythm with PVCs, PAC- Personally Reviewed  ECG    N/A  Physical Exam   GEN: No acute distress.   Neck: No JVD Cardiac: RRR, no murmurs, rubs, or gallops.  Right radial cath site with ecchymosis without hematoma Respiratory: Clear to auscultation  bilaterally. GI: Soft, nontender, non-distended  MS: Right lower extremity edema; No deformity. Neuro:  Nonfocal  Psych: Normal affect   Labs    Chemistry Recent Labs  Lab 05/17/21 1642 05/23/21 0419  NA 139 139  K 3.9 3.4*  CL 97 103  CO2 25 27  GLUCOSE 85 105*  BUN 13 9  CREATININE 1.10 1.01  CALCIUM 9.1 8.7*  GFRNONAA  --  >60  ANIONGAP  --  9     Hematology Recent Labs  Lab 05/17/21 1642 05/23/21 0419  WBC 7.0 8.9  RBC 5.51 4.64  HGB 14.5 12.3*  HCT 45.1 38.1*  MCV 82 82.1  MCH 26.3* 26.5  MCHC 32.2 32.3  RDW 14.5 14.3  PLT 195 152     Radiology    CARDIAC CATHETERIZATION  Result Date: 05/22/2021  Prox RCA lesion is 5% stenosed.  Mid RCA lesion is 85% stenosed.  Previously placed Dist RCA stent (unknown type) is widely patent.  2nd Diag lesion is 100% stenosed.  Mid LAD lesion is 30% stenosed.  Two-vessel coronary obstructive disease with coronary calcification and 30% proximal LAD stenosis, occlusion of the second diagonal branch of the LAD with collaterals and a widely patent  mid LAD stent;  left circumflex without significant obstructive disease; and a very tortuous dominant RCA with patent proximal stent followed by a sharp corkscrew angle with 80% eccentric stenosis with suggestion of possible thrombus in the mid distal portion of the mid stent, and widely patent distal RCA stent. Arduous attempt at PTCA, procedure ultimately aborted despite multiple wires and balloons used ability to cross the mid RCA stent. LVEDP 24 mmHg. RECOMMENDATION: The patient will be hydrated post procedure.  We will review anatomy with colleagues.  Tentatively plan reattempt at intervention tomorrow via the femoral approach. Total contrast 180 cc of Omnipaque. Fluoroscopy time 53 minutes    Cardiac Studies   CORONARY BALLOON ANGIOPLASTY  LEFT HEART CATH AND CORONARY ANGIOGRAPHY    Conclusion    Prox RCA lesion is 5% stenosed. Mid RCA lesion is 85% stenosed. Previously  placed Dist RCA stent (unknown type) is widely patent. 2nd Diag lesion is 100% stenosed. Mid LAD lesion is 30% stenosed.   Two-vessel coronary obstructive disease with coronary calcification and 30% proximal LAD stenosis, occlusion of the second diagonal branch of the LAD with collaterals and a widely patent mid LAD stent;  left circumflex without significant obstructive disease; and a very tortuous dominant RCA with patent proximal stent followed by a sharp corkscrew angle with 80% eccentric stenosis with suggestion of possible thrombus in the mid distal portion of the mid stent, and widely patent distal RCA stent.   Arduous attempt at PTCA, procedure ultimately aborted despite multiple wires and balloons used ability to cross the mid RCA stent.   LVEDP 24 mmHg.   RECOMMENDATION: The patient will be hydrated post procedure.  We will review anatomy with colleagues.  Tentatively plan reattempt at intervention tomorrow via the femoral approach.   Total contrast 180 cc of Omnipaque. Fluoroscopy time 53 minutes   Diagnostic Dominance: Right       Patient Profile     65 y.o. male CAD s/p prior stenting, hypertension, hyperlipidemia and sleep apnea presented for outpatient cardiac catheterization.  Assessment & Plan    CAD -Cardiac catheterization as noted above showing two-vessel obstructive CAD.  Occluded second diagonal branch of the LAD with collaterals.  Torturous RCA with patent proximal stent followed by a sharp corkscrew angle with 80% eccentric stenosis with suggestion of possible thrombus in the mid distal portion of the mid stent, and widely patent distal RCA stent.  Unable to cross lesion despite multiple wires.  Prolonged fluoroscopy time and contrast use.  Renal function stable.  Plan to reattempt via femoral approach today. -Continue aspirin 81 mg and Brilinta 90 mg twice daily (this is new).  Patient reported intermittent shortness of breath since being on Brilinta.  Will  attempt to use caffeine after cath today. -Continue Toprol-XL 100 mg daily, Imdur 30 mg daily and Crestor 40 mg daily  2.  Hypokalemia -Will give supplement  3. HLD - No results found for requested labs within last 8760 hours.  - Continue Crestor 40mg  qd  4. HTN -Continue Toprol-XL -Home l lisinopril and hydrochlorothiazide on hold due to high contrast use -will give as needed hydralazine  5.  Surgical clearance -Per Dr. Geraldo Pitter note patient needs right knee surgery soon. -Interventional cardiologist to recommend antiplatelet therapy  For questions or updates, please contact [facility] Please consult [email or web address] for contact info under        Signed, Leanor Kail, PA  05/23/2021, 8:34 AM     Agree with note by Robbie Lis PA-C  Patient admitted for elective cath.  Dr. Claiborne Billings attempted to treat a distal RCA in-stent restenosis was unsuccessful because of tortuosity via the radial approach.  He received 188 cc of contrast.  Renal function is stable this morning.  Plans are for reattempt via the femoral approach.  Patient has been asymptomatic.   Lorretta Harp, M.D., [place], [facility], [place] [address] [place], [place]  [id number]  [phone] 05/23/2021 10:13 AM

## 2021-05-23 NOTE — Progress Notes (Addendum)
Progress Note  Patient Name: Nicholas Love Date of Encounter: 05/23/2021  [facility] Cardiologist: Dr. Geraldo Pitter  Subjective   Mild intermittent shortness of breath on Brilinta.  No recurrent chest pain.  Inpatient Medications    Scheduled Meds:  aspirin EC  81 mg Oral QPM   fluticasone furoate-vilanterol  1 puff Inhalation Daily   isosorbide mononitrate  30 mg Oral Daily   metoprolol succinate  100 mg Oral Daily   mirtazapine  15 mg Oral QHS   [START ON 05/24/2021] pantoprazole  40 mg Oral q AM   potassium chloride  20 mEq Oral Once   rosuvastatin  40 mg Oral QPM   sodium chloride flush  3 mL Intravenous Q12H   sodium chloride flush  3 mL Intravenous Q12H   ticagrelor  90 mg Oral BID   umeclidinium bromide  1 puff Inhalation Daily   Continuous Infusions:  sodium chloride     sodium chloride     sodium chloride 1 mL/kg/hr (05/23/21 0737)   heparin 1,200 Units/hr (05/23/21 0303)   PRN Meds: sodium chloride, sodium chloride, acetaminophen, albuterol, diazepam, montelukast, nitroGLYCERIN, ondansetron (ZOFRAN) IV, oxyCODONE, sodium chloride flush, sodium chloride flush   Vital Signs    Vitals:   05/23/21 0430 05/23/21 0431 05/23/21 0432 05/23/21 0810  BP: (!) 166/78   (!) 171/85  Pulse: 74 79 78 77  Resp:      Temp:      TempSrc:      SpO2: 96% 97% 97%   Weight:      Height:        Intake/Output Summary (Last 24 hours) at 05/23/2021 0834 Last data filed at 05/23/2021 0700 Gross per 24 hour  Intake 1569.65 ml  Output 2150 ml  Net -580.35 ml   Last 3 Weights 05/22/2021 05/17/2021 04/25/2021  Weight (lbs) 218 lb 218 lb 9.6 oz 226 lb  Weight (kg) 98.884 kg 99.156 kg 102.513 kg      Telemetry    Sinus rhythm with PVCs, PAC- Personally Reviewed  ECG    N/A  Physical Exam   GEN: No acute distress.   Neck: No JVD Cardiac: RRR, no murmurs, rubs, or gallops.  Right radial cath site with ecchymosis without hematoma Respiratory: Clear to auscultation  bilaterally. GI: Soft, nontender, non-distended  MS: Right lower extremity edema; No deformity. Neuro:  Nonfocal  Psych: Normal affect   Labs    Chemistry Recent Labs  Lab 05/17/21 1642 05/23/21 0419  NA 139 139  K 3.9 3.4*  CL 97 103  CO2 25 27  GLUCOSE 85 105*  BUN 13 9  CREATININE 1.10 1.01  CALCIUM 9.1 8.7*  GFRNONAA  --  >60  ANIONGAP  --  9     Hematology Recent Labs  Lab 05/17/21 1642 05/23/21 0419  WBC 7.0 8.9  RBC 5.51 4.64  HGB 14.5 12.3*  HCT 45.1 38.1*  MCV 82 82.1  MCH 26.3* 26.5  MCHC 32.2 32.3  RDW 14.5 14.3  PLT 195 152     Radiology    CARDIAC CATHETERIZATION  Result Date: 05/22/2021  Prox RCA lesion is 5% stenosed.  Mid RCA lesion is 85% stenosed.  Previously placed Dist RCA stent (unknown type) is widely patent.  2nd Diag lesion is 100% stenosed.  Mid LAD lesion is 30% stenosed.  Two-vessel coronary obstructive disease with coronary calcification and 30% proximal LAD stenosis, occlusion of the second diagonal branch of the LAD with collaterals and a widely patent  mid LAD stent;  left circumflex without significant obstructive disease; and a very tortuous dominant RCA with patent proximal stent followed by a sharp corkscrew angle with 80% eccentric stenosis with suggestion of possible thrombus in the mid distal portion of the mid stent, and widely patent distal RCA stent. Arduous attempt at PTCA, procedure ultimately aborted despite multiple wires and balloons used ability to cross the mid RCA stent. LVEDP 24 mmHg. RECOMMENDATION: The patient will be hydrated post procedure.  We will review anatomy with colleagues.  Tentatively plan reattempt at intervention tomorrow via the femoral approach. Total contrast 180 cc of Omnipaque. Fluoroscopy time 53 minutes    Cardiac Studies   CORONARY BALLOON ANGIOPLASTY  LEFT HEART CATH AND CORONARY ANGIOGRAPHY    Conclusion    Prox RCA lesion is 5% stenosed. Mid RCA lesion is 85% stenosed. Previously  placed Dist RCA stent (unknown type) is widely patent. 2nd Diag lesion is 100% stenosed. Mid LAD lesion is 30% stenosed.   Two-vessel coronary obstructive disease with coronary calcification and 30% proximal LAD stenosis, occlusion of the second diagonal branch of the LAD with collaterals and a widely patent mid LAD stent;  left circumflex without significant obstructive disease; and a very tortuous dominant RCA with patent proximal stent followed by a sharp corkscrew angle with 80% eccentric stenosis with suggestion of possible thrombus in the mid distal portion of the mid stent, and widely patent distal RCA stent.   Arduous attempt at PTCA, procedure ultimately aborted despite multiple wires and balloons used ability to cross the mid RCA stent.   LVEDP 24 mmHg.   RECOMMENDATION: The patient will be hydrated post procedure.  We will review anatomy with colleagues.  Tentatively plan reattempt at intervention tomorrow via the femoral approach.   Total contrast 180 cc of Omnipaque. Fluoroscopy time 53 minutes   Diagnostic Dominance: Right       Patient Profile     65 y.o. male CAD s/p prior stenting, hypertension, hyperlipidemia and sleep apnea presented for outpatient cardiac catheterization.  Assessment & Plan    CAD -Cardiac catheterization as noted above showing two-vessel obstructive CAD.  Occluded second diagonal branch of the LAD with collaterals.  Torturous RCA with patent proximal stent followed by a sharp corkscrew angle with 80% eccentric stenosis with suggestion of possible thrombus in the mid distal portion of the mid stent, and widely patent distal RCA stent.  Unable to cross lesion despite multiple wires.  Prolonged fluoroscopy time and contrast use.  Renal function stable.  Plan to reattempt via femoral approach today. -Continue aspirin 81 mg and Brilinta 90 mg twice daily (this is new).  Patient reported intermittent shortness of breath since being on Brilinta.  Will  attempt to use caffeine after cath today. -Continue Toprol-XL 100 mg daily, Imdur 30 mg daily and Crestor 40 mg daily  2.  Hypokalemia -Will give supplement  3. HLD - No results found for requested labs within last 8760 hours.  - Continue Crestor 40mg  qd  4. HTN -Continue Toprol-XL -Home l lisinopril and hydrochlorothiazide on hold due to high contrast use -will give as needed hydralazine  5.  Surgical clearance -Per Dr. Geraldo Pitter note patient needs right knee surgery soon. -Interventional cardiologist to recommend antiplatelet therapy  For questions or updates, please contact [facility] Please consult [email or web address] for contact info under        Signed, Leanor Kail, PA  05/23/2021, 8:34 AM     Agree with note by Robbie Lis PA-C  Patient admitted for elective cath.  Dr. Claiborne Billings attempted to treat a distal RCA in-stent restenosis was unsuccessful because of tortuosity via the radial approach.  He received 188 cc of contrast.  Renal function is stable this morning.  Plans are for reattempt via the femoral approach.  Patient has been asymptomatic.   Lorretta Harp, M.D., [place], [facility], [place] [address] [place], [place]  [id number]  [phone] 05/23/2021 10:13 AM

## 2021-05-24 ENCOUNTER — Encounter (HOSPITAL_COMMUNITY): Payer: Self-pay | Admitting: Cardiovascular Disease

## 2021-05-24 ENCOUNTER — Other Ambulatory Visit (HOSPITAL_COMMUNITY): Payer: Self-pay

## 2021-05-24 DIAGNOSIS — I25110 Atherosclerotic heart disease of native coronary artery with unstable angina pectoris: Principal | ICD-10-CM

## 2021-05-24 LAB — CBC
HCT: 39.8 % (ref 39.0–52.0)
Hemoglobin: 12.7 g/dL — ABNORMAL LOW (ref 13.0–17.0)
MCH: 26.8 pg (ref 26.0–34.0)
MCHC: 31.9 g/dL (ref 30.0–36.0)
MCV: 84.1 fL (ref 80.0–100.0)
Platelets: 150 10*3/uL (ref 150–400)
RBC: 4.73 MIL/uL (ref 4.22–5.81)
RDW: 14.6 % (ref 11.5–15.5)
WBC: 7.6 10*3/uL (ref 4.0–10.5)
nRBC: 0 % (ref 0.0–0.2)

## 2021-05-24 LAB — BASIC METABOLIC PANEL
Anion gap: 14 (ref 5–15)
BUN: 7 mg/dL — ABNORMAL LOW (ref 8–23)
CO2: 19 mmol/L — ABNORMAL LOW (ref 22–32)
Calcium: 8.9 mg/dL (ref 8.9–10.3)
Chloride: 107 mmol/L (ref 98–111)
Creatinine, Ser: 0.87 mg/dL (ref 0.61–1.24)
GFR, Estimated: >60 mL/min (ref >60)
Glucose, Bld: 96 mg/dL (ref 70–99)
Potassium: 3.9 mmol/L (ref 3.5–5.1)
Sodium: 140 mmol/L (ref 135–145)

## 2021-05-24 LAB — LIPID PANEL
Cholesterol: 133 mg/dL (ref 0–200)
HDL: 32 mg/dL — ABNORMAL LOW (ref >40)
LDL Cholesterol: 53 mg/dL (ref 0–99)
Total CHOL/HDL Ratio: 4.2 RATIO
Triglycerides: 238 mg/dL — ABNORMAL HIGH (ref <150)
VLDL: 48 mg/dL — ABNORMAL HIGH (ref 0–40)

## 2021-05-24 MED ORDER — HYDROCHLOROTHIAZIDE 25 MG PO TABS
25.0000 mg | ORAL_TABLET | Freq: Every day | ORAL | Status: DC
  Administered 2021-05-24: 25 mg via ORAL
  Filled 2021-05-24: qty 1

## 2021-05-24 MED ORDER — ISOSORBIDE MONONITRATE ER 30 MG PO TB24
30.0000 mg | ORAL_TABLET | Freq: Every day | ORAL | 2 refills | Status: DC
  Filled 2021-05-24: qty 30, 30d supply, fill #0

## 2021-05-24 MED ORDER — CLOPIDOGREL BISULFATE 75 MG PO TABS
75.0000 mg | ORAL_TABLET | Freq: Every day | ORAL | 11 refills | Status: DC
  Filled 2021-05-24: qty 30, 30d supply, fill #0

## 2021-05-24 MED ORDER — LISINOPRIL 40 MG PO TABS
40.0000 mg | ORAL_TABLET | Freq: Every day | ORAL | Status: DC
  Administered 2021-05-24: 40 mg via ORAL
  Filled 2021-05-24: qty 1

## 2021-05-24 MED ORDER — CLOPIDOGREL BISULFATE 75 MG PO TABS: 75.0000 mg | ORAL_TABLET | Freq: Every day | ORAL | Status: DC

## 2021-05-24 MED ORDER — CLOPIDOGREL BISULFATE 75 MG PO TABS
600.0000 mg | ORAL_TABLET | Freq: Once | ORAL | Status: AC
  Administered 2021-05-24: 600 mg via ORAL
  Filled 2021-05-24: qty 8

## 2021-05-24 MED FILL — Nitroglycerin IV Soln 100 MCG/ML in D5W: INTRA_ARTERIAL | Qty: 10 | Status: AC

## 2021-05-24 NOTE — Progress Notes (Signed)
Pt is alert and oriented. Discharge instructions/ AVS given to pt. 

## 2021-05-24 NOTE — Discharge Instructions (Addendum)
Medication Changes: - START Plavix 75mg  daily in addition to the Aspirin 81mg  daily that you already take. These medications are very important. - START Imdur 30mg  daily. - STOP Ibuprofen. Recommended Tylenol as needed instead for pain. However, do not take more than 3,000mg  of Tylenol per day.  Otherwise, continue all home medications as directed elsewhere on discharge paperwork.  You already have a follow-up visit with Dr. Salley Scarlet scheduled for 06/28/2021. However, I would like you to be seen sooner so that we can recheck your cath site and make sure you are doing OK. Our office will call you to help schedule this appointment.  Post Cardiac Catheterization: NO HEAVY LIFTING OR SEXUAL ACTIVITY X 7 DAYS. NO DRIVING X 3-5 DAYS. NO SOAKING BATHS, HOT TUBS, POOLS, ETC., X 7 DAYS.  Radial Site Care: Refer to this sheet in the next few weeks. These instructions provide you with information on caring for yourself after your procedure. Your caregiver may also give you more specific instructions. Your treatment has been planned according to current medical practices, but problems sometimes occur. Call your caregiver if you have any problems or questions after your procedure. HOME CARE INSTRUCTIONS You may shower the day after the procedure. Remove the bandage (dressing) and gently wash the site with plain soap and water. Gently pat the site dry.  Do not apply powder or lotion to the site.  Do not submerge the affected site in water for 3 to 5 days.  Inspect the site at least twice daily.  Do not flex or bend the affected arm for 24 hours.  No lifting over 5 pounds (2.3 kg) for 5 days after your procedure.  Do not drive home if you are discharged the same day of the procedure. Have someone else drive you.  What to expect: Any bruising will usually fade within 1 to 2 weeks.  Blood that collects in the tissue (hematoma) may be painful to the touch. It should usually decrease in size and tenderness  within 1 to 2 weeks.  SEEK IMMEDIATE MEDICAL CARE IF: You have unusual pain at the radial site.  You have redness, warmth, swelling, or pain at the radial site.  You have drainage (other than a small amount of blood on the dressing).  You have chills.  You have a fever or persistent symptoms for more than 72 hours.  You have a fever and your symptoms suddenly get worse.  Your arm becomes pale, cool, tingly, or numb.  You have heavy bleeding from the site. Hold pressure on the site.   _______________  Groin Site Care: Refer to this sheet in the next few weeks. These instructions provide you with information on caring for yourself after your procedure. Your caregiver may also give you more specific instructions. Your treatment has been planned according to current medical practices, but problems sometimes occur. Call your caregiver if you have any problems or questions after your procedure. HOME CARE INSTRUCTIONS You may shower 24 hours after the procedure. Remove the bandage (dressing) and gently wash the site with plain soap and water. Gently pat the site dry.  Do not apply powder or lotion to the site.  Do not sit in a bathtub, swimming pool, or whirlpool for 5 to 7 days.  No bending, squatting, or lifting anything over 10 pounds (4.5 kg) as directed by your caregiver.  Inspect the site at least twice daily.  Do not drive home if you are discharged the same day of the procedure. Have  someone else drive you.  What to expect: Any bruising will usually fade within 1 to 2 weeks.  Blood that collects in the tissue (hematoma) may be painful to the touch. It should usually decrease in size and tenderness within 1 to 2 weeks.  SEEK IMMEDIATE MEDICAL CARE IF: You have unusual pain at the groin site or down the affected leg.  You have redness, warmth, swelling, or pain at the groin site.  You have drainage (other than a small amount of blood on the dressing).  You have chills.  You have a fever  or persistent symptoms for more than 72 hours.  You have a fever and your symptoms suddenly get worse.  Your leg becomes pale, cool, tingly, or numb.  You have heavy bleeding from the site. Hold pressure on the site.

## 2021-05-24 NOTE — Progress Notes (Signed)
CARDIAC REHAB PHASE I   Offered to walk with pt, pt requesting condom cath be removed prior to ambulation, and is anxious to go home. Pt educated on importance of ASA and Plavix. Pt given heart healthy diet. Reviewed site care, restrictions, and ambulation as able. Pt disappointed his other procedures need to be delayed but states understanding. Pt anxious to go home. Will refer to [facility] [place].  [id number] Rufina Falco, RN BSN 05/24/2021 9:15 AM

## 2021-05-24 NOTE — Discharge Summary (Addendum)
Discharge Summary    Patient ID: Nicholas Love MRN: [id number]; DOB: 1956-11-26  Admit date: 05/22/2021 Discharge date: 05/24/2021  PCP:  Ocie Doyne., MD   [facility] Providers Cardiologist:  Jenean Lindau, MD   {   Discharge Diagnoses    Principal Problem:   Unstable angina [facility]) Active Problems:   Coronary artery disease   Chronic obstructive pulmonary disease ([place])   Essential hypertension   GERD (gastroesophageal reflux disease)   Hyperlipemia    Diagnostic Studies/Procedures    Left Cardiac Catheterization 05/22/2021: Prox RCA lesion is 5% stenosed. Mid RCA lesion is 85% stenosed. Previously placed Dist RCA stent (unknown type) is widely patent. 2nd Diag lesion is 100% stenosed. Mid LAD lesion is 30% stenosed.   Two-vessel coronary obstructive disease with coronary calcification and 30% proximal LAD stenosis, occlusion of the second diagonal branch of the LAD with collaterals and a widely patent mid LAD stent;  left circumflex without significant obstructive disease; and a very tortuous dominant RCA with patent proximal stent followed by a sharp corkscrew angle with 80% eccentric stenosis with suggestion of possible thrombus in the mid distal portion of the mid stent, and widely patent distal RCA stent.   Arduous attempt at PTCA, procedure ultimately aborted despite multiple wires and balloons used ability to cross the mid RCA stent.   LVEDP 24 mmHg.   Recommendation: The patient will be hydrated post procedure.  We will review anatomy with colleagues.  Tentatively plan reattempt at intervention tomorrow via the femoral approach.   Total contrast 180 cc of Omnipaque. Fluoroscopy time 53 minutes _____________  Coronary Balloon Angioplasty 05/23/2021: Prox RCA lesion is 5% stenosed. Non-stenotic Dist RCA lesion was previously treated. Mid RCA-1 lesion is 50% stenosed. Mid RCA-2 lesion is 85% stenosed. Post intervention, there is a 5% residual  stenosis. Post intervention, there is a 5% residual stenosis. Mid LAD lesion is 30% stenosed. 2nd Diag lesion is 100% stenosed.   Successful percutaneous coronary intervention to the mid RCA stent with initial teleport microcatheter support over a long wire with initial ScoreFlex balloon dilatations and ultimate noncompliant balloon dilatation up to 3.5 mm.  The diffuse in-stent restenosis in the mid RCA stent was reduced to less than 5%.   Recommendation: DAPT for minimum of 1 year and preferably longer.  Optimal blood pressure control and lipid management.  Post procedure.  Bivalirudin will be discontinued upon completion of his current bag with ultimate sheath removal 2 hours later.  Anticipate discharge tomorrow a.m.  Diagnostic Dominance: Right    Intervention      Implants       History of Present Illness     Nicholas Love is a 65 y.o. male with a history of CAD s/p prior PCI, hypertension, hyperlipidemia, COPD, sleep apnea, and GERD with Barrett's esophagus. He was recently seen by Dr. Geraldo Pitter on 05/17/2021 at which time he reported substernal chest tightness that radiated to his neck and into his arm the day before while washing his car. Pain resolved with Nitro. Patient also stated he was contemplating having knee surgery. Decision was made to proceed with outpatient cardiac catheterization for further evaluation.  Hospital Course     Consultants: None  Unstable Angina CAD Patient presented to [facility] on [facility]2021 for outpatient cardiac catheterization. Cath showed 2 vessel obstructive CAD with an occluded 2nd Diag branch of the LAD with collaterals and a torturous RCA with patent proximal stent followed by a sharp corkscrew angle with 80%  eccentric stenosis with suggestion of possible thrombus in the mid stent. Distal RCA stent was widely patent. Lesion was unable to be crossed despite use of multiple wired. Given prolonged fluoroscopy time and contrast use, decision  was made to bring patient back to the cath lab the following day for femoral approach. Patient was brought back to the cath lab on 05/23/2021 for this and underwent successful PCI with balloon angioplasty of the mid RCA lesion. The diffuse in-stent restenosis was reduced to less than 5%. Patient tolerated procedure well. He has ecchymosis of right femoral cath site and is quite tender to palpation but area is soft and no bruit present. Plan is for DAPT. He was initially started on Brilinta but was unable to tolerated due to transient shortness of breath. This was very bothersome to patient and he refused doses of Brilinta in the hospital. Therefore, he was transitioned to Plavix. Will load with 600mg  prior to discharge and will then start maintenance dose of 75mg  daily tomorrow. Continue home Aspirin 81mg . He was started on Imdur 30mg  daily this admission. Will continue home Toprol-XL and Crestor.  Of note, patient needs knee surgery. This will need to be delayed for at least 3 months, possibly 6 months. Patient will follow-up with Dr. Geraldo Pitter prior. Will defer to Dr. Geraldo Pitter on when he is OK for surgery.  Hypertension BP elevated with systolic BP as high as the 180s at times. However, home HCTZ and Lisinopril were being held for cardiac catheterization. Will restart HCTZ 25mg  daily and Lisinopril 40mg  daily. Also started on Imdur 30mg  this admission. Continue home Toprol-XL 100mg  twice daily. Suspect we will need to continue to adjust antihypertensive at outpatient follow-up.   Hyperlipidemia Lipid panel this admission: Total Cholesterol 133, Triglycerides 238, HDL 32, LDL 53. At LDL goal <70 given CAD. Continue Crestor 40mg  daily. Can consider adding Vascepa as an outpatient.   Hypokalemia Potassium slightly low at 3.4 on 05/23/2021. This was repleted and improved to 3.9 on day of discharge. Repeat BMET at follow-up visit.  COPD Continue home inhalers.  Patient seen and examined by Dr. Gwenlyn Found today  and determined to stable for discharge. Outpatient follow-up has been arranged. Medications as below.  Did the patient have an acute coronary syndrome (MI, NSTEMI, STEMI, etc) this admission?:  No                               Did the patient have a percutaneous coronary intervention (stent / angioplasty)?:  Yes.     Cath/PCI Registry Performance & Quality Measures: Aspirin prescribed? - Yes ADP Receptor Inhibitor (Plavix/Clopidogrel, Brilinta/Ticagrelor or Effient/Prasugrel) prescribed (includes medically managed patients)? - Yes High Intensity Statin (Lipitor 40-80mg  or Crestor 20-40mg ) prescribed? - Yes For EF <40%, was ACEI/ARB prescribed? - Not Applicable (EF >/= 29%) For EF <40%, Aldosterone Antagonist (Spironolactone or Eplerenone) prescribed? - Not Applicable (EF >/= 52%) Cardiac Rehab Phase II ordered? - Yes   _____________  Discharge Vitals Blood pressure (!) 168/83, pulse 90, temperature 98.2 F (36.8 C), temperature source Oral, resp. rate 18, height 6' (1.829 m), weight 98.9 kg, SpO2 99 %.  Filed Weights   05/22/21 0658  Weight: 98.9 kg   General: 65 y.o. male resting comfortably in no acute distress. HEENT: Normocephalic and atraumatic. Sclera clear.  Neck: Supple.  Heart: RRR. Distinct S1 and S2. No murmurs, gallops, or rubs. Ecchymosis around right radial cath site but soft and has a good  pulse. Ecchymosis at right femoral cath site as well with significant tenderness with light palpation but area soft. No bruit or sign of pseudoaneurysm. Lungs: No increased work of breathing. Clear to ausculation bilaterally. No wheezes, rhonchi, or rales.  Abdomen: Soft, non-distended, and non-tender to palpation.  MSK: Normal strength and tone for age. Extremities: Trace lower extremity edema bilaterally.    Skin: Warm and dry. Neuro: Alert and oriented x3. No focal deficits. Psych: Normal affect. Responds appropriately.  Labs & Radiologic Studies    CBC Recent Labs     05/23/21 0419 05/24/21 0220  WBC 8.9 7.6  HGB 12.3* 12.7*  HCT 38.1* 39.8  MCV 82.1 84.1  PLT 152 102   Basic Metabolic Panel Recent Labs    05/23/21 0419 05/24/21 0220  NA 139 140  K 3.4* 3.9  CL 103 107  CO2 27 19*  GLUCOSE 105* 96  BUN 9 7*  CREATININE 1.01 0.87  CALCIUM 8.7* 8.9   Liver Function Tests No results for input(s): AST, ALT, ALKPHOS, BILITOT, PROT, ALBUMIN in the last 72 hours. No results for input(s): LIPASE, AMYLASE in the last 72 hours. High Sensitivity Troponin:   No results for input(s): TROPONINIHS in the last 720 hours.  BNP Invalid input(s): POCBNP D-Dimer No results for input(s): DDIMER in the last 72 hours. Hemoglobin A1C No results for input(s): HGBA1C in the last 72 hours. Fasting Lipid Panel Recent Labs    05/24/21 0220  CHOL 133  HDL 32*  LDLCALC 53  TRIG 238*  CHOLHDL 4.2   Thyroid Function Tests No results for input(s): TSH, T4TOTAL, T3FREE, THYROIDAB in the last 72 hours.  Invalid input(s): FREET3 _____________  CARDIAC CATHETERIZATION  Result Date: 05/23/2021  Prox RCA lesion is 5% stenosed.  Non-stenotic Dist RCA lesion was previously treated.  Mid RCA-1 lesion is 50% stenosed.  Mid RCA-2 lesion is 85% stenosed.  Post intervention, there is a 5% residual stenosis.  Post intervention, there is a 5% residual stenosis.  Mid LAD lesion is 30% stenosed.  2nd Diag lesion is 100% stenosed.  Successful percutaneous coronary intervention to the mid RCA stent with initial teleport microcatheter support over a long wire with initial ScoreFlex balloon dilatations and ultimate noncompliant balloon dilatation up to 3.5 mm.  The diffuse in-stent restenosis in the mid RCA stent was reduced to less than 5%. RECOMMENDATION: DAPT for minimum of 1 year and preferably longer.  Optimal blood pressure control and lipid management.  Post procedure.  Bivalirudin will be discontinued upon completion of his current bag with ultimate sheath removal 2  hours later.  Anticipate discharge tomorrow a.m.   CARDIAC CATHETERIZATION  Result Date: 05/22/2021  Prox RCA lesion is 5% stenosed.  Mid RCA lesion is 85% stenosed.  Previously placed Dist RCA stent (unknown type) is widely patent.  2nd Diag lesion is 100% stenosed.  Mid LAD lesion is 30% stenosed.  Two-vessel coronary obstructive disease with coronary calcification and 30% proximal LAD stenosis, occlusion of the second diagonal branch of the LAD with collaterals and a widely patent mid LAD stent;  left circumflex without significant obstructive disease; and a very tortuous dominant RCA with patent proximal stent followed by a sharp corkscrew angle with 80% eccentric stenosis with suggestion of possible thrombus in the mid distal portion of the mid stent, and widely patent distal RCA stent. Arduous attempt at PTCA, procedure ultimately aborted despite multiple wires and balloons used ability to cross the mid RCA stent. LVEDP 24 mmHg. RECOMMENDATION: The  patient will be hydrated post procedure.  We will review anatomy with colleagues.  Tentatively plan reattempt at intervention tomorrow via the femoral approach. Total contrast 180 cc of Omnipaque. Fluoroscopy time 53 minutes   Disposition   Patient is being discharged home today in good condition.  Follow-up Plans & Appointments     Follow-up Information     Revankar, Reita Cliche, MD Follow up.   Specialty: Cardiology Why: You already have follow-up with Dr. Geraldo Pitter scheduled for 06/28/2021 at 9:00am. However, our office will call you to help schedule a sooner appointment so we can recheck her femoral (groin) cath site. Contact information: [place] [address] [phone]                Discharge Instructions     Diet - low sodium heart healthy   Complete by: As directed    Increase activity slowly   Complete by: As directed        Discharge Medications   Allergies as of 05/24/2021       Reactions    Ceftriaxone Anaphylaxis   Rocephin    Cefdinir Rash   Ceftin [cefuroxime] Rash   Ciprofloxacin Rash        Medication List     STOP taking these medications    ibuprofen 800 MG tablet Commonly known as: ADVIL       TAKE these medications    albuterol (2.5 MG/3ML) 0.083% nebulizer solution Commonly known as: PROVENTIL Take 2.5 mg by nebulization every 2 (two) hours as needed for wheezing or shortness of breath.   aspirin EC 81 MG tablet Take 81 mg by mouth every evening.   Breo Ellipta 100-25 MCG/INH Aepb Generic drug: fluticasone furoate-vilanterol Inhale 1 puff into the lungs daily.   clopidogrel 75 MG tablet Commonly known as: PLAVIX Take 1 tablet (75 mg total) by mouth daily. Start taking on: May 25, 2021   dicyclomine 10 MG capsule Commonly known as: BENTYL Take 10 mg by mouth every 6 (six) hours as needed for diarrhea or loose stools (abdominal pain).   hydrochlorothiazide 25 MG tablet Commonly known as: HYDRODIURIL Take 1 tablet (25 mg total) by mouth daily. What changed: when to take this   isosorbide mononitrate 30 MG 24 hr tablet Commonly known as: IMDUR Take 1 tablet (30 mg total) by mouth daily.   lisinopril 40 MG tablet Commonly known as: ZESTRIL Take 40 mg by mouth in the morning.   Lubricant Eye Drops 0.4-0.3 % Soln Generic drug: Polyethyl Glycol-Propyl Glycol Place 1-2 drops into both eyes 3 (three) times daily as needed (dry/irritated eyes.).   metoprolol succinate 100 MG 24 hr tablet Commonly known as: TOPROL-XL Take 100 mg by mouth in the morning and at bedtime. Take with or immediately following a meal.   mirtazapine 15 MG tablet Commonly known as: REMERON Take 15 mg by mouth at bedtime.   montelukast 10 MG tablet Commonly known as: SINGULAIR Take 10 mg by mouth daily as needed (allergies).   nitroGLYCERIN 0.4 MG SL tablet Commonly known as: NITROSTAT Place 0.4 mg under the tongue every 5 (five) minutes x 3 doses as needed  for chest pain.   Oxycodone HCl 10 MG Tabs Take 10 mg by mouth every 4 (four) hours as needed for pain.   pantoprazole 40 MG tablet Commonly known as: PROTONIX Take 40 mg by mouth in the morning.   rosuvastatin 40 MG tablet Commonly known as: CRESTOR Take 40 mg by mouth every  evening.   Spiriva Respimat 1.25 MCG/ACT Aers Generic drug: Tiotropium Bromide Monohydrate Inhale 2 puffs into the lungs in the morning.   tiZANidine 4 MG tablet Commonly known as: ZANAFLEX Take 4 mg by mouth at bedtime.   Vitamin D (Ergocalciferol) 1.25 MG (50000 UNIT) Caps capsule Commonly known as: DRISDOL Take 50,000 Units by mouth every Saturday.           Outstanding Labs/Studies   Repeat BMET at follow-up visit.  Duration of Discharge Encounter   Greater than 30 minutes including physician time.  Signed, Darreld Mclean, PA-C 05/24/2021, 9:02 AM    Agree with note by Sande Rives, PA-C  Mr. Belmar was admitted for chest pain.  He underwent radial cath by Dr. Claiborne Billings revealing in-stent restenosis within the mid to distal dominant RCA which was a very tortuous vessel and had previous stents.  Dr. Claiborne Billings was unable to get down the vessel initially from the radial approach and yesterday he underwent femoral approach intervention with an excellent result using Cutting Balloon atherectomy followed by noncompliant balloon angioplasty.  He does have ecchymosis in his groin which is mildly tender but no clinical evidence of pseudoaneurysm formation.  He stable for discharge home today on dual antiplatelet therapy.  Follow-up with Dr. Geraldo Pitter in [place].   Lorretta Harp, M.D., [place], [facility], [place] [address] [place], [place]  [address]  [phone] 05/24/2021 10:08 AM

## 2021-05-25 ENCOUNTER — Telehealth: Payer: Self-pay | Admitting: Cardiology

## 2021-05-25 DIAGNOSIS — J449 Chronic obstructive pulmonary disease, unspecified: Secondary | ICD-10-CM | POA: Diagnosis not present

## 2021-05-25 DIAGNOSIS — E559 Vitamin D deficiency, unspecified: Secondary | ICD-10-CM | POA: Diagnosis not present

## 2021-05-25 DIAGNOSIS — I2510 Atherosclerotic heart disease of native coronary artery without angina pectoris: Secondary | ICD-10-CM | POA: Diagnosis not present

## 2021-05-25 DIAGNOSIS — M545 Low back pain, unspecified: Secondary | ICD-10-CM | POA: Diagnosis not present

## 2021-05-25 DIAGNOSIS — I10 Essential (primary) hypertension: Secondary | ICD-10-CM | POA: Diagnosis not present

## 2021-05-25 DIAGNOSIS — E118 Type 2 diabetes mellitus with unspecified complications: Secondary | ICD-10-CM | POA: Diagnosis not present

## 2021-05-25 DIAGNOSIS — G4730 Sleep apnea, unspecified: Secondary | ICD-10-CM | POA: Diagnosis not present

## 2021-05-25 NOTE — Telephone Encounter (Signed)
After receiving message from [place] pt and offered him an appt for tomorrow, 06/17. Pt stated he would like to keep his current appt on 07/20.  kbl

## 2021-05-25 NOTE — Telephone Encounter (Signed)
-----   Message from Gita Kudo, RN sent at 05/24/2021  9:07 AM EDT ----- Regarding: FW: Hospital Follow-up  ----- Message ----- From: Darreld Mclean, PA-C Sent: 05/24/2021   9:06 AM EDT To: Rebeca Alert [facility]/Hp Scheduling Subject: Hospital Follow-up                             This patient is being discharged from the hospital today. He has a follow-up visit with Dr. Geraldo Pitter scheduled for 06/28/2021 but he really needs to be seen sooner than this. He had a balloon angioplasty. He has a lot of tenderness and bruising around femoral cath site so I think this cath site should be rechecked prior to 06/28/2021. I know availability is limited but can you please help schedule him a sooner appointment.  Thank you so much! Callie

## 2021-05-29 ENCOUNTER — Telehealth: Payer: Self-pay | Admitting: Cardiology

## 2021-05-29 NOTE — Telephone Encounter (Signed)
Pt c/o medication issue:  1. Name of Medication:  isosorbide mononitrate (IMDUR) 30 MG 24 hr tablet  2. How are you currently taking this medication (dosage and times per day)?  1 tablet (30 mg total) by mouth day  3. Are you having a reaction (difficulty breathing--STAT)?  No   4. What is your medication issue?   Patient's wife states the patient's BP (diastolic) has been low and she assumes this medication may be the cause.  Pt c/o BP issue: STAT if pt c/o blurred vision, one-sided weakness or slurred speech  1. What are your last 5 BP readings?  No readings available, but patient's wife states it is ranging around 116/55.  2. Are you having any other symptoms (ex. Dizziness, headache, blurred vision, passed out)?  No   3. What is your BP issue?   Patient's wife states the patient's diastolic has been in the 38'F. She the patient even stopped Imdur and his diastolic number is still lower.   Patient's wife would also like to know if forms from the [place] have been received. She states they were faxed to the office on 06/16 or 06/17.  She states the forms need to be signed and returned to the [place] in order for them to cover patient's recent hospital expenses.

## 2021-05-29 NOTE — Telephone Encounter (Signed)
Pt states that his BP have improved since he has been able to drink fluids. Pt reports his BP has now been 124/60, 115/68 and 143/69. Pt is aware that we have received his forms as well.

## 2021-06-01 ENCOUNTER — Telehealth (HOSPITAL_COMMUNITY): Payer: Self-pay

## 2021-06-01 NOTE — Telephone Encounter (Signed)
Per phase I cardiac rehab, fax cardiac rehab referral to [place] [facility] [facility]. °

## 2021-06-06 ENCOUNTER — Other Ambulatory Visit (HOSPITAL_BASED_OUTPATIENT_CLINIC_OR_DEPARTMENT_OTHER): Payer: Self-pay

## 2021-06-06 ENCOUNTER — Telehealth (HOSPITAL_COMMUNITY): Payer: Self-pay | Admitting: Pharmacist

## 2021-06-06 NOTE — Telephone Encounter (Signed)
Transitions of Care Pharmacy   Call attempted for a pharmacy transitions of carefollow-up. No voicemail left.   Call attempt #1. Will follow-up in 2-3 days.

## 2021-06-20 DIAGNOSIS — M51369 Other intervertebral disc degeneration, lumbar region without mention of lumbar back pain or lower extremity pain: Secondary | ICD-10-CM

## 2021-06-20 DIAGNOSIS — Z0189 Encounter for other specified special examinations: Secondary | ICD-10-CM | POA: Diagnosis not present

## 2021-06-20 DIAGNOSIS — M961 Postlaminectomy syndrome, not elsewhere classified: Secondary | ICD-10-CM

## 2021-06-20 DIAGNOSIS — M50322 Other cervical disc degeneration at C5-C6 level: Secondary | ICD-10-CM | POA: Diagnosis not present

## 2021-06-20 DIAGNOSIS — G8929 Other chronic pain: Secondary | ICD-10-CM | POA: Insufficient documentation

## 2021-06-20 DIAGNOSIS — M5030 Other cervical disc degeneration, unspecified cervical region: Secondary | ICD-10-CM

## 2021-06-20 DIAGNOSIS — M25561 Pain in right knee: Secondary | ICD-10-CM

## 2021-06-20 DIAGNOSIS — Z0289 Encounter for other administrative examinations: Secondary | ICD-10-CM

## 2021-06-20 DIAGNOSIS — M5136 Other intervertebral disc degeneration, lumbar region: Secondary | ICD-10-CM | POA: Insufficient documentation

## 2021-06-20 HISTORY — DX: Other chronic pain: G89.29

## 2021-06-20 HISTORY — DX: Other intervertebral disc degeneration, lumbar region: M51.36

## 2021-06-20 HISTORY — DX: Pain in right knee: M25.561

## 2021-06-20 HISTORY — DX: Other intervertebral disc degeneration, lumbar region without mention of lumbar back pain or lower extremity pain: M51.369

## 2021-06-20 HISTORY — DX: Other cervical disc degeneration, unspecified cervical region: M50.30

## 2021-06-20 HISTORY — DX: Encounter for other administrative examinations: Z02.89

## 2021-06-20 HISTORY — DX: Postlaminectomy syndrome, not elsewhere classified: M96.1

## 2021-06-22 ENCOUNTER — Other Ambulatory Visit (HOSPITAL_COMMUNITY): Payer: Self-pay

## 2021-06-22 ENCOUNTER — Telehealth (HOSPITAL_COMMUNITY): Payer: Self-pay

## 2021-06-22 ENCOUNTER — Telehealth: Payer: Self-pay | Admitting: Cardiology

## 2021-06-22 NOTE — Telephone Encounter (Signed)
pt has stopped taking plavix.. pharmacist would like to inform Dr. of this

## 2021-06-22 NOTE — Telephone Encounter (Signed)
Pharmacy Transitions of Care Follow-up Telephone Call  Date of discharge: 05/24/21  Discharge Diagnosis: S/P PTCA  How have you been since you were released from the hospital?  Patient is currently being followed by pain management and is in a lot of pain. He has stopped taking his Plavix because he states he cannot stop taking his pain meds that interact with the Plavix. His cardiologist is unaware and he has a follow up on 06/28/21. Office has now been informed of patient stopping the med.  Medication changes made at discharge:     START taking: aspirin EC  clopidogrel (PLAVIX)  isosorbide mononitrate (IMDUR)   CHANGE how you take: hydrochlorothiazide (HYDRODIURIL)   STOP taking: ibuprofen 800 MG tablet (ADVIL)  Medication changes verified by the patient?  Yes    Medication Accessibility:  Home Pharmacy:  [facility]  Was the patient provided with refills on discharged medications? Yes   Have all prescriptions been transferred from [facility] to home pharmacy?  Yes  Is the patient able to afford medications? Patient has [employer] [facility]    Medication Review:  Did not discuss Plavix because patient has stopped taking the medication. Cardiology has been notified of this.  Follow-up Appointments:  PCP Hospital f/u appt confirmed? None currently scheduled  Specialist Hospital f/u appt confirmed? Yes Scheduled to see Dr. Geraldo Pitter on 06/28/21 @ Cardiology.   If their condition worsens, is the pt aware to call PCP or go to the Emergency Dept.? Yes  Final Patient Assessment: Patient has stopped taking medications, but has follow up scheduled and refills at home pharmacy.

## 2021-06-26 ENCOUNTER — Other Ambulatory Visit: Payer: Self-pay

## 2021-06-27 NOTE — Telephone Encounter (Signed)
Called and spoke with patient as to why he had stopped taking the Plavix and it was because his pain doctor has him on 800 mg of Ibuprofen.  And was told the Plavix would cause internal bleeding.  Patient was also prescribed sublingual morphine however patient cannot afford this medication.

## 2021-06-27 NOTE — Telephone Encounter (Signed)
Spoke with patient and discussed the risk of not taking plavix. Pt has been advised that he could have a heart attack which could cause him irreversible damage, loss of wages due to inability to work and or loss of life. Pt verbalized understanding and states that the pain management doctor was to call him back regarding the cost of the medication. Dr. Geraldo Pitter is aware.

## 2021-06-28 ENCOUNTER — Ambulatory Visit: Payer: No Typology Code available for payment source | Admitting: Cardiology

## 2021-07-10 DIAGNOSIS — J449 Chronic obstructive pulmonary disease, unspecified: Secondary | ICD-10-CM | POA: Diagnosis not present

## 2021-07-10 DIAGNOSIS — B349 Viral infection, unspecified: Secondary | ICD-10-CM | POA: Diagnosis not present

## 2021-07-10 DIAGNOSIS — J069 Acute upper respiratory infection, unspecified: Secondary | ICD-10-CM | POA: Diagnosis not present

## 2021-07-12 DIAGNOSIS — J301 Allergic rhinitis due to pollen: Secondary | ICD-10-CM | POA: Diagnosis not present

## 2021-07-18 DIAGNOSIS — Z760 Encounter for issue of repeat prescription: Secondary | ICD-10-CM | POA: Diagnosis not present

## 2021-07-18 DIAGNOSIS — M25561 Pain in right knee: Secondary | ICD-10-CM | POA: Diagnosis not present

## 2021-07-18 DIAGNOSIS — M961 Postlaminectomy syndrome, not elsewhere classified: Secondary | ICD-10-CM | POA: Diagnosis not present

## 2021-07-18 DIAGNOSIS — M5030 Other cervical disc degeneration, unspecified cervical region: Secondary | ICD-10-CM | POA: Diagnosis not present

## 2021-07-18 DIAGNOSIS — M5136 Other intervertebral disc degeneration, lumbar region: Secondary | ICD-10-CM | POA: Diagnosis not present

## 2021-07-18 DIAGNOSIS — Z5181 Encounter for therapeutic drug level monitoring: Secondary | ICD-10-CM | POA: Diagnosis not present

## 2021-07-18 DIAGNOSIS — G8929 Other chronic pain: Secondary | ICD-10-CM | POA: Diagnosis not present

## 2021-07-18 DIAGNOSIS — Z79891 Long term (current) use of opiate analgesic: Secondary | ICD-10-CM | POA: Diagnosis not present

## 2021-08-02 ENCOUNTER — Other Ambulatory Visit: Payer: Self-pay

## 2021-08-03 ENCOUNTER — Ambulatory Visit (INDEPENDENT_AMBULATORY_CARE_PROVIDER_SITE_OTHER): Payer: No Typology Code available for payment source | Admitting: Cardiology

## 2021-08-03 ENCOUNTER — Encounter: Payer: Self-pay | Admitting: Cardiology

## 2021-08-03 ENCOUNTER — Other Ambulatory Visit: Payer: Self-pay

## 2021-08-03 VITALS — BP 166/76 | HR 70 | Ht 72.0 in | Wt 214.4 lb

## 2021-08-03 DIAGNOSIS — I10 Essential (primary) hypertension: Secondary | ICD-10-CM

## 2021-08-03 DIAGNOSIS — E782 Mixed hyperlipidemia: Secondary | ICD-10-CM | POA: Diagnosis not present

## 2021-08-03 DIAGNOSIS — I209 Angina pectoris, unspecified: Secondary | ICD-10-CM | POA: Diagnosis not present

## 2021-08-03 DIAGNOSIS — I2510 Atherosclerotic heart disease of native coronary artery without angina pectoris: Secondary | ICD-10-CM | POA: Diagnosis not present

## 2021-08-03 NOTE — Patient Instructions (Signed)

## 2021-08-03 NOTE — Progress Notes (Signed)
Cardiology Office Note:    Date:  08/03/2021   ID:  Nicholas Love, DOB 1956/05/11, MRN [id number]  PCP:  Penelope Coop, FNP  Cardiologist:  Jenean Lindau, MD   Referring MD: Ocie Doyne., MD    ASSESSMENT:    1. Angina pectoris ([place])   2. Coronary artery disease involving native coronary artery of native heart without angina pectoris   3. Essential hypertension   4. Mixed hyperlipidemia    PLAN:    In order of problems listed above:  Coronary artery disease: Secondary prevention stressed with the patient.  Importance of compliance with diet medication stressed any vocalized understanding.  I cautioned him about compliance.  I told him that he must go on his Plavix ASAP.  He understands and promises to do so.  He will go home and take his Plavix today and every day ASAP. Preop assessment: He plans to have knee replacement surgery.  I told him not to have the surgery as it is high risk.  He needs to antiplatelet therapy as appropriately recommended by our interventional colleagues.  He understands.  He will take the help of pain management clinic to assess his pain symptoms. Essential hypertension: Blood pressure stable and diet is emphasized.  His blood pressure is elevated because of his pain and he will optimize pain medications. Dyslipidemia: Lipids reviewed.  He has hypertriglyceridemia" was emphasized.  He promises to do better. Results of coronary angiography discussed with the patient at length and questions were answered to his satisfaction.Patient will be seen in follow-up appointment in 6 months or earlier if the patient has any concerns    Medication Adjustments/Labs and Tests Ordered: Current medicines are reviewed at length with the patient today.  Concerns regarding medicines are outlined above.  No orders of the defined types were placed in this encounter.  No orders of the defined types were placed in this encounter.    No chief complaint on file.     History of Present Illness:    Nicholas Love is a 65 y.o. male.  Patient has past medical history of coronary artery disease, essential hypertension dyslipidemia and COPD.  Patient recently underwent coronary angiography and the anatomy was challenging and he was brought in the next day for balloon angioplasty.  The details of that procedure are given below.  He denies any problems at this time except his significant knee pain.  No chest pain orthopnea or PND.  Unfortunately for some unclear reason he stopped taking Plavix.  At the time of my evaluation, the patient is alert awake oriented and in no distress.  Past Medical History:  Diagnosis Date   Angina pectoris ([place])    Arthritis    Asthma    Barrett esophagus    Bilateral primary osteoarthritis of knee 05/16/2021   Bronchiectasis ([place])    CAD (coronary artery disease)    a. prior mild RCAx2 and LAD stenting. b. same day PCI DES to dRCA 12/2017, 100% occ oD1 with L-L collaterals, otherwise nonobstructive disease, EF normal.   Chronic obstructive pulmonary disease ([place]) 12/17/2016   Formatting of this note might be different from the original. PFTs 01/30/17 moderate obstruction Ratio 69%, FEV1 76%, FVC 85%   Chronic pain of right knee 06/20/2021   Last Assessment & Plan:  Formatting of this note might be different from the original. Review of imaging notes severe degenerative changes and MRI identified insufficiency fractures of the femoral condyle.  He is following with  Orthopedics and considering total knee arthroplasty.   COPD (chronic obstructive pulmonary disease) (HCC)    Coronary artery disease    DDD (degenerative disc disease), cervical 06/20/2021   Last Assessment & Plan:  Formatting of this note might be different from the original. Cervical imaging shows vertebral body height maintained, approximately 2 mm anterolisthesis of C5 on 6.  There is moderate degenerative disc height loss at C5-6.  Slight bony neuroforaminal narrowing more  advanced on the right.  At this time low back and right knee pain or more limiting complaints.   Essential hypertension 01/17/2018   GERD (gastroesophageal reflux disease)    Hiatal hernia    Hyperlipemia    Hypertension    Lumbar post-laminectomy syndrome 06/20/2021   Last Assessment & Plan:  Formatting of this note might be different from the original. This is a 65 year old male who is new to the clinic today with chronic low back pain, status post lumbosacral fusion.  Review of lumbar imaging identifies some adjacent segment degeneration with notable disc height loss at L1-2 level, above his previous L2 to the sacrum fusion.    Today we will take over his chr   Mixed dyslipidemia 01/17/2018   Myocardial infarct [facility])    Pain medication agreement 06/20/2021   Last Assessment & Plan:  Formatting of this note might be different from the original. Agreement signed today.  Narcan prescribed.   Sleep apnea    SOB (shortness of breath)    Unstable angina ([place]) 05/22/2021    Past Surgical History:  Procedure Laterality Date   arthroscopic knee     BACK SURGERY     x3 lower back   CARDIAC CATHETERIZATION     3 stents   CORONARY BALLOON ANGIOPLASTY N/A 05/22/2021   Procedure: CORONARY BALLOON ANGIOPLASTY;  Surgeon: Troy Sine, MD;  Location: [place] [facility];  Service: Cardiovascular;  Laterality: N/A;  PTCA   ABORTED   CORONARY BALLOON ANGIOPLASTY N/A 05/23/2021   Procedure: CORONARY BALLOON ANGIOPLASTY;  Surgeon: Troy Sine, MD;  Location: [place] [facility];  Service: Cardiovascular;  Laterality: N/A;   CORONARY STENT INTERVENTION N/A 12/20/2017   Procedure: CORONARY STENT INTERVENTION;  Surgeon: Martinique, Peter M, MD;  Location: [place] [facility];  Service: Cardiovascular;  Laterality: N/A;   FRACTURE SURGERY  1970's   ankle   LEFT HEART CATH AND CORONARY ANGIOGRAPHY N/A 12/20/2017   Procedure: LEFT HEART CATH AND CORONARY ANGIOGRAPHY;  Surgeon: Martinique, Peter M, MD;  Location: [place] [facility];  Service: Cardiovascular;  Laterality: N/A;   LEFT HEART CATH AND CORONARY ANGIOGRAPHY N/A 05/22/2021   Procedure: LEFT HEART CATH AND CORONARY ANGIOGRAPHY;  Surgeon: Troy Sine, MD;  Location: [place] [facility];  Service: Cardiovascular;  Laterality: N/A;    Current Medications: Current Meds  Medication Sig   albuterol (PROVENTIL) (2.5 MG/3ML) 0.083% nebulizer solution Take 2.5 mg by nebulization every 2 (two) hours as needed for wheezing or shortness of breath.   aspirin EC 81 MG tablet Take 81 mg by mouth every evening.    Budeson-Glycopyrrol-Formoterol (BREZTRI AEROSPHERE) 160-9-4.8 MCG/ACT AERO Inhale 2 puffs into the lungs 2 (two) times daily.   buprenorphine (BUTRANS) 15 MCG/HR Place 1 patch onto the skin once a week.   hydrochlorothiazide (HYDRODIURIL) 25 MG tablet Take 25 mg by mouth daily.   lisinopril (ZESTRIL) 40 MG tablet Take 40 mg by mouth in the morning.   metoprolol succinate (TOPROL-XL) 100 MG 24 hr tablet  Take 100 mg by mouth in the morning and at bedtime. Take with or immediately following a meal.   mirtazapine (REMERON) 15 MG tablet Take 15 mg by mouth at bedtime.   nitroGLYCERIN (NITROSTAT) 0.4 MG SL tablet Place 0.4 mg under the tongue every 5 (five) minutes x 3 doses as needed for chest pain.   Oxycodone HCl 10 MG TABS Take 10 mg by mouth every 4 (four) hours as needed for pain.   pantoprazole (PROTONIX) 40 MG tablet Take 40 mg by mouth in the morning.   Polyethyl Glycol-Propyl Glycol (LUBRICANT EYE DROPS) 0.4-0.3 % SOLN Place 1-2 drops into both eyes 3 (three) times daily as needed for dry eyes (dry/irritated eyes.).   rosuvastatin (CRESTOR) 40 MG tablet Take 40 mg by mouth every evening.    SPIRIVA RESPIMAT 1.25 MCG/ACT AERS Inhale 2 puffs into the lungs in the morning.   tiZANidine (ZANAFLEX) 4 MG tablet Take 4 mg by mouth at bedtime.    Vitamin D, Ergocalciferol, (DRISDOL) 1.25 MG (50000 UNIT) CAPS capsule Take 50,000 Units by mouth every  Saturday.     Allergies:   Ceftriaxone, Cefdinir, Ceftin [cefuroxime], and Ciprofloxacin   Social History   Socioeconomic History   Marital status: Married    Spouse name: Not on file   Number of children: Not on file   Years of education: Not on file   Highest education level: Not on file  Occupational History   Not on file  Tobacco Use   Smoking status: Never   Smokeless tobacco: Never  Substance and Sexual Activity   Alcohol use: Not on file   Drug use: Not on file   Sexual activity: Not on file  Other Topics Concern   Not on file  Social History Narrative   Not on file   Social Determinants of Health   Financial Resource Strain: Not on file  Food Insecurity: Not on file  Transportation Needs: Not on file  Physical Activity: Not on file  Stress: Not on file  Social Connections: Not on file     Family History: The patient's Family history is unknown by patient.  ROS:   Please see the history of present illness.    All other systems reviewed and are negative.  EKGs/Labs/Other Studies Reviewed:    The following studies were reviewed today: Troy Sine, MD (Primary)     Procedures  CORONARY BALLOON ANGIOPLASTY   Conclusion    Prox RCA lesion is 5% stenosed. Non-stenotic Dist RCA lesion was previously treated. Mid RCA-1 lesion is 50% stenosed. Mid RCA-2 lesion is 85% stenosed. Post intervention, there is a 5% residual stenosis. Post intervention, there is a 5% residual stenosis. Mid LAD lesion is 30% stenosed. 2nd Diag lesion is 100% stenosed.   Successful percutaneous coronary intervention to the mid RCA stent with initial teleport microcatheter support over a long wire with initial ScoreFlex balloon dilatations and ultimate noncompliant balloon dilatation up to 3.5 mm.  The diffuse in-stent restenosis in the mid RCA stent was reduced to less than 5%.   RECOMMENDATION: DAPT for minimum of 1 year and preferably longer.  Optimal blood pressure  control and lipid management.  Post procedure.  Bivalirudin will be discontinued upon completion of his current bag with ultimate sheath removal 2 hours later.  Anticipate discharge tomorrow a.m.   Recent Labs: 05/24/2021: BUN 7; Creatinine, Ser 0.87; Hemoglobin 12.7; Platelets 150; Potassium 3.9; Sodium 140  Recent Lipid Panel    Component Value Date/Time  CHOL 133 05/24/2021 0220   CHOL 184 01/17/2018 0923   TRIG 238 (H) 05/24/2021 0220   HDL 32 (L) 05/24/2021 0220   HDL 42 01/17/2018 0923   CHOLHDL 4.2 05/24/2021 0220   VLDL 48 (H) 05/24/2021 0220   LDLCALC 53 05/24/2021 0220   LDLCALC 117 (H) 01/17/2018 0923    Physical Exam:    VS:  BP (!) 166/76   Pulse 70   Ht 6' (1.829 m)   Wt 214 lb 6.4 oz (97.3 kg)   SpO2 95%   BMI 29.08 kg/m     Wt Readings from Last 3 Encounters:  08/03/21 214 lb 6.4 oz (97.3 kg)  05/22/21 218 lb (98.9 kg)  05/17/21 218 lb 9.6 oz (99.2 kg)     GEN: Patient is in no acute distress HEENT: Normal NECK: No JVD; No carotid bruits LYMPHATICS: No lymphadenopathy CARDIAC: Hear sounds regular, 2/6 systolic murmur at the apex. RESPIRATORY:  Clear to auscultation without rales, wheezing or rhonchi  ABDOMEN: Soft, non-tender, non-distended MUSCULOSKELETAL:  No edema; No deformity  SKIN: Warm and dry NEUROLOGIC:  Alert and oriented x 3 PSYCHIATRIC:  Normal affect   Signed, Jenean Lindau, MD  08/03/2021 10:48 AM    [place]

## 2021-08-04 ENCOUNTER — Telehealth: Payer: Self-pay | Admitting: Cardiology

## 2021-08-04 MED ORDER — CLOPIDOGREL BISULFATE 75 MG PO TABS: 75.0000 mg | ORAL_TABLET | Freq: Every day | ORAL | 3 refills | Status: DC

## 2021-08-04 NOTE — Telephone Encounter (Signed)
Pt c/o medication issue:  1. Name of Medication: clopidogrel (PLAVIX) 75 MG tablet   2. How are you currently taking this medication (dosage and times per day)? Take 1 tablet (75 mg total) by mouth daily.  3. Are you having a reaction (difficulty breathing--STAT)? no  4. What is your medication issue? Pt states that Dr. Julieanne Manson is wanting him to continue on Plavix for 6 months before he is cleared for surgery, Plavix is showing as discontinued as of yesterday.. please advise

## 2021-08-04 NOTE — Telephone Encounter (Signed)
Spoke with patient about his Plavix and explained to him that because he stated he took himself off of it at the visit on 08/03/21 it was removed from his medications list.  He would like for me to send a new prescription into [facility] so he can begin taking it again.  Patient had no additional questions and thanked me for the help.

## 2021-08-17 ENCOUNTER — Ambulatory Visit: Payer: PPO | Admitting: Cardiology

## 2021-09-04 DIAGNOSIS — J449 Chronic obstructive pulmonary disease, unspecified: Secondary | ICD-10-CM | POA: Diagnosis not present

## 2021-09-04 DIAGNOSIS — B349 Viral infection, unspecified: Secondary | ICD-10-CM | POA: Diagnosis not present

## 2021-09-28 DIAGNOSIS — J42 Unspecified chronic bronchitis: Secondary | ICD-10-CM | POA: Diagnosis not present

## 2021-09-28 DIAGNOSIS — Z7984 Long term (current) use of oral hypoglycemic drugs: Secondary | ICD-10-CM | POA: Diagnosis not present

## 2021-09-28 DIAGNOSIS — J479 Bronchiectasis, uncomplicated: Secondary | ICD-10-CM | POA: Diagnosis not present

## 2021-09-28 DIAGNOSIS — G4733 Obstructive sleep apnea (adult) (pediatric): Secondary | ICD-10-CM | POA: Diagnosis not present

## 2021-09-28 DIAGNOSIS — K2270 Barrett's esophagus without dysplasia: Secondary | ICD-10-CM | POA: Diagnosis not present

## 2021-09-28 DIAGNOSIS — I10 Essential (primary) hypertension: Secondary | ICD-10-CM | POA: Diagnosis not present

## 2021-09-28 DIAGNOSIS — J9610 Chronic respiratory failure, unspecified whether with hypoxia or hypercapnia: Secondary | ICD-10-CM | POA: Diagnosis not present

## 2021-09-28 DIAGNOSIS — K5790 Diverticulosis of intestine, part unspecified, without perforation or abscess without bleeding: Secondary | ICD-10-CM | POA: Diagnosis not present

## 2021-09-28 DIAGNOSIS — E119 Type 2 diabetes mellitus without complications: Secondary | ICD-10-CM | POA: Diagnosis not present

## 2021-10-02 DIAGNOSIS — J449 Chronic obstructive pulmonary disease, unspecified: Secondary | ICD-10-CM | POA: Diagnosis not present

## 2021-10-17 DIAGNOSIS — G8929 Other chronic pain: Secondary | ICD-10-CM | POA: Diagnosis not present

## 2021-10-17 DIAGNOSIS — M50322 Other cervical disc degeneration at C5-C6 level: Secondary | ICD-10-CM | POA: Diagnosis not present

## 2021-10-17 DIAGNOSIS — F1190 Opioid use, unspecified, uncomplicated: Secondary | ICD-10-CM

## 2021-10-17 DIAGNOSIS — M25561 Pain in right knee: Secondary | ICD-10-CM | POA: Diagnosis not present

## 2021-10-17 DIAGNOSIS — Z760 Encounter for issue of repeat prescription: Secondary | ICD-10-CM | POA: Diagnosis not present

## 2021-10-17 DIAGNOSIS — Z79891 Long term (current) use of opiate analgesic: Secondary | ICD-10-CM | POA: Diagnosis not present

## 2021-10-17 DIAGNOSIS — M5136 Other intervertebral disc degeneration, lumbar region: Secondary | ICD-10-CM | POA: Diagnosis not present

## 2021-10-17 DIAGNOSIS — M961 Postlaminectomy syndrome, not elsewhere classified: Secondary | ICD-10-CM | POA: Diagnosis not present

## 2021-10-17 HISTORY — DX: Opioid use, unspecified, uncomplicated: F11.90

## 2021-11-09 ENCOUNTER — Ambulatory Visit: Payer: No Typology Code available for payment source | Admitting: Cardiology

## 2021-11-21 DIAGNOSIS — H25043 Posterior subcapsular polar age-related cataract, bilateral: Secondary | ICD-10-CM | POA: Diagnosis not present

## 2021-11-21 DIAGNOSIS — H2512 Age-related nuclear cataract, left eye: Secondary | ICD-10-CM | POA: Diagnosis not present

## 2021-11-21 DIAGNOSIS — H18413 Arcus senilis, bilateral: Secondary | ICD-10-CM | POA: Diagnosis not present

## 2021-11-21 DIAGNOSIS — H25013 Cortical age-related cataract, bilateral: Secondary | ICD-10-CM | POA: Diagnosis not present

## 2021-11-21 DIAGNOSIS — H2513 Age-related nuclear cataract, bilateral: Secondary | ICD-10-CM | POA: Diagnosis not present

## 2021-12-08 DIAGNOSIS — Z006 Encounter for examination for normal comparison and control in clinical research program: Secondary | ICD-10-CM

## 2021-12-08 NOTE — Research (Signed)
Optimize Research Study  4 Year Follow-up  No medication changes to report at this time. No adverse events to report since the last event in June 2022 which was reported at that time.   Current Outpatient Medications:    albuterol (PROVENTIL) (2.5 MG/3ML) 0.083% nebulizer solution, Take 2.5 mg by nebulization every 2 (two) hours as needed for wheezing or shortness of breath., Disp: , Rfl:    aspirin EC 81 MG tablet, Take 81 mg by mouth every evening. , Disp: , Rfl:    Budeson-Glycopyrrol-Formoterol (BREZTRI AEROSPHERE) 160-9-4.8 MCG/ACT AERO, Inhale 2 puffs into the lungs 2 (two) times daily., Disp: , Rfl:    buprenorphine (BUTRANS) 15 MCG/HR, Place 1 patch onto the skin once a week., Disp: , Rfl:    clopidogrel (PLAVIX) 75 MG tablet, Take 1 tablet (75 mg total) by mouth daily., Disp: 90 tablet, Rfl: 3   hydrochlorothiazide (HYDRODIURIL) 25 MG tablet, Take 25 mg by mouth daily., Disp: , Rfl:    lisinopril (ZESTRIL) 40 MG tablet, Take 40 mg by mouth in the morning., Disp: , Rfl:    metoprolol succinate (TOPROL-XL) 100 MG 24 hr tablet, Take 100 mg by mouth in the morning and at bedtime. Take with or immediately following a meal., Disp: , Rfl:    mirtazapine (REMERON) 15 MG tablet, Take 15 mg by mouth at bedtime., Disp: , Rfl:    nitroGLYCERIN (NITROSTAT) 0.4 MG SL tablet, Place 0.4 mg under the tongue every 5 (five) minutes x 3 doses as needed for chest pain., Disp: , Rfl:    Oxycodone HCl 10 MG TABS, Take 10 mg by mouth every 4 (four) hours as needed for pain., Disp: , Rfl:    pantoprazole (PROTONIX) 40 MG tablet, Take 40 mg by mouth in the morning., Disp: , Rfl:    Polyethyl Glycol-Propyl Glycol (LUBRICANT EYE DROPS) 0.4-0.3 % SOLN, Place 1-2 drops into both eyes 3 (three) times daily as needed for dry eyes (dry/irritated eyes.)., Disp: , Rfl:    rosuvastatin (CRESTOR) 40 MG tablet, Take 40 mg by mouth every evening. , Disp: , Rfl:    SPIRIVA RESPIMAT 1.25 MCG/ACT AERS, Inhale 2 puffs into the  lungs in the morning., Disp: , Rfl:    tiZANidine (ZANAFLEX) 4 MG tablet, Take 4 mg by mouth at bedtime. , Disp: , Rfl:    Vitamin D, Ergocalciferol, (DRISDOL) 1.25 MG (50000 UNIT) CAPS capsule, Take 50,000 Units by mouth every Saturday., Disp: , Rfl:

## 2021-12-10 DIAGNOSIS — G8929 Other chronic pain: Secondary | ICD-10-CM

## 2021-12-10 HISTORY — DX: Other chronic pain: G89.29

## 2021-12-11 DIAGNOSIS — J441 Chronic obstructive pulmonary disease with (acute) exacerbation: Secondary | ICD-10-CM | POA: Diagnosis not present

## 2021-12-11 DIAGNOSIS — I10 Essential (primary) hypertension: Secondary | ICD-10-CM | POA: Diagnosis not present

## 2021-12-18 DIAGNOSIS — I10 Essential (primary) hypertension: Secondary | ICD-10-CM | POA: Diagnosis not present

## 2021-12-18 DIAGNOSIS — J449 Chronic obstructive pulmonary disease, unspecified: Secondary | ICD-10-CM | POA: Diagnosis not present

## 2022-01-12 ENCOUNTER — Ambulatory Visit (INDEPENDENT_AMBULATORY_CARE_PROVIDER_SITE_OTHER): Payer: PPO | Admitting: Cardiology

## 2022-01-12 ENCOUNTER — Encounter: Payer: Self-pay | Admitting: Cardiology

## 2022-01-12 ENCOUNTER — Other Ambulatory Visit: Payer: Self-pay

## 2022-01-12 VITALS — BP 166/64 | HR 70 | Ht 72.0 in | Wt 223.4 lb

## 2022-01-12 DIAGNOSIS — E782 Mixed hyperlipidemia: Secondary | ICD-10-CM

## 2022-01-12 DIAGNOSIS — I10 Essential (primary) hypertension: Secondary | ICD-10-CM | POA: Diagnosis not present

## 2022-01-12 DIAGNOSIS — I2510 Atherosclerotic heart disease of native coronary artery without angina pectoris: Secondary | ICD-10-CM

## 2022-01-12 DIAGNOSIS — G4730 Sleep apnea, unspecified: Secondary | ICD-10-CM | POA: Diagnosis not present

## 2022-01-12 NOTE — Patient Instructions (Signed)
Please call [phone] with the name of your orthopedic surgeon.   Medication Instructions:  Your physician recommends that you continue on your current medications as directed. Please refer to the Current Medication list given to you today.  *If you need a refill on your cardiac medications before your next appointment, please call your pharmacy*   Lab Work: None ordered If you have labs (blood work) drawn today and your tests are completely normal, you will receive your results only by: [place] (if you have MyChart) OR A paper copy in the mail If you have any lab test that is abnormal or we need to change your treatment, we will call you to review the results.   Testing/Procedures: None ordered   Follow-Up: At [facility], you and your health needs are our priority.  As part of our continuing mission to provide you with exceptional heart care, we have created designated Provider Care Teams.  These Care Teams include your primary Cardiologist (physician) and Advanced Practice Providers (APPs -  Physician Assistants and Nurse Practitioners) who all work together to provide you with the care you need, when you need it.  We recommend signing up for the patient portal called "MyChart".  Sign up information is provided on this After Visit Summary.  MyChart is used to connect with patients for Virtual Visits (Telemedicine).  Patients are able to view lab/test results, encounter notes, upcoming appointments, etc.  Non-urgent messages can be sent to your provider as well.   To learn more about what you can do with MyChart, go to [email or web address].    Your next appointment:   6 month(s)  The format for your next appointment:   In Person  Provider:   Jyl Heinz, MD   Other Instructions NA

## 2022-01-12 NOTE — Progress Notes (Signed)
Cardiology Office Note:    Date:  01/12/2022   ID:  Nicholas Love, DOB 06/19/1956, MRN [id number]  PCP:  Penelope Coop, FNP  Cardiologist:  Jenean Lindau, MD   Referring MD: Ocie Doyne., MD    ASSESSMENT:    1. Coronary artery disease involving native coronary artery of native heart without angina pectoris   2. Essential hypertension   3. Mixed dyslipidemia   4. Sleep apnea, unspecified type    PLAN:    In order of problems listed above:  Coronary artery disease: Secondary prevention stressed with the patient.  Importance of compliance with diet medication stressed any vocalized understanding. Preoperative evaluation: I told him that I would like to know from his orthopedic surgeon as to if his aspirin needs to be off for the surgery.  He will give his call and let us know who his orthopedic surgeon is.  He does not remember the name at this point.  If the aspirin is to be discontinued then I would not approve the surgery in view of high risk.  If the aspirin can be continued then I will do a stress Lexiscan sestamibi to assess his risk.  I discussed this with him and he vocalized understanding.  He is not keen on continuing dual antiplatelet therapy.  I respect his wishes.  Risks explained and he vocalized understanding. Essential hypertension: Blood pressure stable.  He has significant pain issues which is driving his blood pressure.  I told him to talk to his pain doctor about his pain management. Mixed dyslipidemia: Diet was emphasized.  Lifestyle modification urged.  Lipids followed by primary care. Patient will be seen in follow-up appointment in 6 months or earlier if the patient has any concerns    Medication Adjustments/Labs and Tests Ordered: Current medicines are reviewed at length with the patient today.  Concerns regarding medicines are outlined above.  No orders of the defined types were placed in this encounter.  No orders of the defined types were placed in  this encounter.    No chief complaint on file.    History of Present Illness:    Nicholas Love is a 66 y.o. male.  Patient has past medical history of coronary artery disease, essential hypertension, mixed dyslipidemia and sleep apnea.  He has significant orthopedic issues for which she is going to be active knee and ankle surgery.  He denies any chest pain orthopnea or PND.  At the time of my evaluation, the patient is alert awake oriented and in no distress.  Unfortunately he is not taking Plavix at this time and I am not clear why he is not doing so.  He was clearly advised on the last visit.  Past Medical History:  Diagnosis Date   Angina pectoris ([place])    Arthritis    Asthma    Barrett esophagus    Bilateral primary osteoarthritis of knee 05/16/2021   Bronchiectasis ([place])    CAD (coronary artery disease)    a. prior mild RCAx2 and LAD stenting. b. same day PCI DES to dRCA 12/2017, 100% occ oD1 with L-L collaterals, otherwise nonobstructive disease, EF normal.   Chronic obstructive pulmonary disease ([place]) 12/17/2016   Formatting of this note might be different from the original. PFTs 01/30/17 moderate obstruction Ratio 69%, FEV1 76%, FVC 85%   Chronic pain of right knee 06/20/2021   Last Assessment & Plan:  Formatting of this note might be different from the original. Review of imaging  notes severe degenerative changes and MRI identified insufficiency fractures of the femoral condyle.  He is following with Orthopedics and considering total knee arthroplasty.   Chronic, continuous use of opioids 10/17/2021   Last Assessment & Plan:  Formatting of this note might be different from the original. Patient and I have discussed the hazardous effects of continued opiate pain medication usage. Risks and benefits of above medications including but not limited to possibility of respiratory depression, sedation, and even death were discussed with the patient who expressed an understanding.  Patient did  not displ   COPD (chronic obstructive pulmonary disease) ([place])    Coronary artery disease    DDD (degenerative disc disease), cervical 06/20/2021   Last Assessment & Plan:  Formatting of this note might be different from the original. Cervical imaging shows vertebral body height maintained, approximately 2 mm anterolisthesis of C5 on 6.  There is moderate degenerative disc height loss at C5-6.  Slight bony neuroforaminal narrowing more advanced on the right.  At this time low back and right knee pain or more limiting complaints.   DDD (degenerative disc disease), lumbar 06/20/2021   Last Assessment & Plan:  Formatting of this note might be different from the original. See lumbar post laminectomy plan   Essential hypertension 01/17/2018   GERD (gastroesophageal reflux disease)    Hiatal hernia    Hyperlipemia    Hypertension    Lumbar post-laminectomy syndrome 06/20/2021   Last Assessment & Plan:  Formatting of this note might be different from the original. This is a 66 year old male who is new to the clinic today with chronic low back pain, status post lumbosacral fusion.  Review of lumbar imaging identifies some adjacent segment degeneration with notable disc height loss at L1-2 level, above his previous L2 to the sacrum fusion.    Today we will take over his chr   Mixed dyslipidemia 01/17/2018   Myocardial infarct [facility])    Pain medication agreement 06/20/2021   Last Assessment & Plan:  Formatting of this note might be different from the original. Agreement signed today.  Narcan prescribed.   Sleep apnea    SOB (shortness of breath)    Unstable angina ([place]) 05/22/2021    Past Surgical History:  Procedure Laterality Date   arthroscopic knee     BACK SURGERY     x3 lower back   CARDIAC CATHETERIZATION     3 stents   CORONARY BALLOON ANGIOPLASTY N/A 05/22/2021   Procedure: CORONARY BALLOON ANGIOPLASTY;  Surgeon: Troy Sine, MD;  Location: [place] [facility];  Service: Cardiovascular;   Laterality: N/A;  PTCA   ABORTED   CORONARY BALLOON ANGIOPLASTY N/A 05/23/2021   Procedure: CORONARY BALLOON ANGIOPLASTY;  Surgeon: Troy Sine, MD;  Location: [place] [facility];  Service: Cardiovascular;  Laterality: N/A;   CORONARY STENT INTERVENTION N/A 12/20/2017   Procedure: CORONARY STENT INTERVENTION;  Surgeon: Martinique, Peter M, MD;  Location: [place] [facility];  Service: Cardiovascular;  Laterality: N/A;   FRACTURE SURGERY  1970's   ankle   LEFT HEART CATH AND CORONARY ANGIOGRAPHY N/A 12/20/2017   Procedure: LEFT HEART CATH AND CORONARY ANGIOGRAPHY;  Surgeon: Martinique, Peter M, MD;  Location: [place] [facility];  Service: Cardiovascular;  Laterality: N/A;   LEFT HEART CATH AND CORONARY ANGIOGRAPHY N/A 05/22/2021   Procedure: LEFT HEART CATH AND CORONARY ANGIOGRAPHY;  Surgeon: Troy Sine, MD;  Location: [place] [facility];  Service: Cardiovascular;  Laterality: N/A;  Current Medications: Current Meds  Medication Sig   albuterol (PROVENTIL) (2.5 MG/3ML) 0.083% nebulizer solution Take 2.5 mg by nebulization every 2 (two) hours as needed for wheezing or shortness of breath.   aspirin EC 81 MG tablet Take 81 mg by mouth every evening.    Budeson-Glycopyrrol-Formoterol (BREZTRI AEROSPHERE) 160-9-4.8 MCG/ACT AERO Inhale 2 puffs into the lungs 2 (two) times daily.   hydrochlorothiazide (HYDRODIURIL) 25 MG tablet Take 25 mg by mouth daily.   ibuprofen (ADVIL) 800 MG tablet Take 1,600 mg by mouth daily.   lisinopril (ZESTRIL) 40 MG tablet Take 40 mg by mouth in the morning.   metoprolol succinate (TOPROL-XL) 100 MG 24 hr tablet Take 100 mg by mouth in the morning and at bedtime. Take with or immediately following a meal.   mirtazapine (REMERON) 15 MG tablet Take 15 mg by mouth at bedtime.   nitroGLYCERIN (NITROSTAT) 0.4 MG SL tablet Place 0.4 mg under the tongue every 5 (five) minutes x 3 doses as needed for chest pain.   Oxycodone HCl 10 MG TABS Take 10 mg by mouth every 4 (four) hours  as needed for pain.   pantoprazole (PROTONIX) 40 MG tablet Take 40 mg by mouth in the morning.   Polyethyl Glycol-Propyl Glycol (LUBRICANT EYE DROPS) 0.4-0.3 % SOLN Place 1-2 drops into both eyes 3 (three) times daily as needed for dry eyes (dry/irritated eyes.).   rosuvastatin (CRESTOR) 40 MG tablet Take 40 mg by mouth every evening.    tiZANidine (ZANAFLEX) 4 MG tablet Take 4 mg by mouth at bedtime.      Allergies:   Ceftriaxone, Cefdinir, Ceftin [cefuroxime], and Ciprofloxacin   Social History   Socioeconomic History   Marital status: Married    Spouse name: Not on file   Number of children: Not on file   Years of education: Not on file   Highest education level: Not on file  Occupational History   Not on file  Tobacco Use   Smoking status: Never   Smokeless tobacco: Never  Substance and Sexual Activity   Alcohol use: Not on file   Drug use: Not on file   Sexual activity: Not on file  Other Topics Concern   Not on file  Social History Narrative   Not on file   Social Determinants of Health   Financial Resource Strain: Not on file  Food Insecurity: Not on file  Transportation Needs: Not on file  Physical Activity: Not on file  Stress: Not on file  Social Connections: Not on file     Family History: The patient's Family history is unknown by patient.  ROS:   Please see the history of present illness.    All other systems reviewed and are negative.  EKGs/Labs/Other Studies Reviewed:    The following studies were reviewed today: I discussed my findings with the patient at length.   Recent Labs: 05/24/2021: BUN 7; Creatinine, Ser 0.87; Hemoglobin 12.7; Platelets 150; Potassium 3.9; Sodium 140  Recent Lipid Panel    Component Value Date/Time   CHOL 133 05/24/2021 0220   CHOL 184 01/17/2018 0923   TRIG 238 (H) 05/24/2021 0220   HDL 32 (L) 05/24/2021 0220   HDL 42 01/17/2018 0923   CHOLHDL 4.2 05/24/2021 0220   VLDL 48 (H) 05/24/2021 0220   LDLCALC 53  05/24/2021 0220   LDLCALC 117 (H) 01/17/2018 0923    Physical Exam:    VS:  BP (!) 166/64    Pulse 70  Ht 6' (1.829 m)    Wt 223 lb 6.4 oz (101.3 kg)    SpO2 95%    BMI 30.30 kg/m     Wt Readings from Last 3 Encounters:  01/12/22 223 lb 6.4 oz (101.3 kg)  08/03/21 214 lb 6.4 oz (97.3 kg)  05/22/21 218 lb (98.9 kg)     GEN: Patient is in no acute distress HEENT: Normal NECK: No JVD; No carotid bruits LYMPHATICS: No lymphadenopathy CARDIAC: Hear sounds regular, 2/6 systolic murmur at the apex. RESPIRATORY:  Clear to auscultation without rales, wheezing or rhonchi  ABDOMEN: Soft, non-tender, non-distended MUSCULOSKELETAL:  No edema; No deformity  SKIN: Warm and dry NEUROLOGIC:  Alert and oriented x 3 PSYCHIATRIC:  Normal affect   Signed, Jenean Lindau, MD  01/12/2022 2:39 PM    [place]

## 2022-01-15 ENCOUNTER — Telehealth: Payer: Self-pay

## 2022-01-15 DIAGNOSIS — K5790 Diverticulosis of intestine, part unspecified, without perforation or abscess without bleeding: Secondary | ICD-10-CM | POA: Diagnosis not present

## 2022-01-15 DIAGNOSIS — D692 Other nonthrombocytopenic purpura: Secondary | ICD-10-CM | POA: Diagnosis not present

## 2022-01-15 DIAGNOSIS — E785 Hyperlipidemia, unspecified: Secondary | ICD-10-CM | POA: Diagnosis not present

## 2022-01-15 DIAGNOSIS — M1711 Unilateral primary osteoarthritis, right knee: Secondary | ICD-10-CM | POA: Diagnosis not present

## 2022-01-15 DIAGNOSIS — I700 Atherosclerosis of aorta: Secondary | ICD-10-CM | POA: Diagnosis not present

## 2022-01-15 DIAGNOSIS — E119 Type 2 diabetes mellitus without complications: Secondary | ICD-10-CM | POA: Diagnosis not present

## 2022-01-15 DIAGNOSIS — I10 Essential (primary) hypertension: Secondary | ICD-10-CM | POA: Diagnosis not present

## 2022-01-15 DIAGNOSIS — G4733 Obstructive sleep apnea (adult) (pediatric): Secondary | ICD-10-CM | POA: Diagnosis not present

## 2022-01-15 DIAGNOSIS — K219 Gastro-esophageal reflux disease without esophagitis: Secondary | ICD-10-CM | POA: Diagnosis not present

## 2022-01-15 DIAGNOSIS — J9809 Other diseases of bronchus, not elsewhere classified: Secondary | ICD-10-CM | POA: Diagnosis not present

## 2022-01-15 DIAGNOSIS — J479 Bronchiectasis, uncomplicated: Secondary | ICD-10-CM | POA: Diagnosis not present

## 2022-01-15 DIAGNOSIS — K2270 Barrett's esophagus without dysplasia: Secondary | ICD-10-CM | POA: Diagnosis not present

## 2022-01-15 NOTE — Telephone Encounter (Signed)
Left VM for Angie to callback to discuss pt's surgical clearance. If pt can continue his aspirin than he can precede with his surgery and if not he must postpone for 1 year.

## 2022-01-15 NOTE — Telephone Encounter (Signed)
Spoke with Levada Dy at [facility] and advised that pt may precede with his surgery if he can stay on Aspirin. Records faxed to [facility]. Left VM for pt that we have spoken with the [place].

## 2022-01-23 DIAGNOSIS — M25561 Pain in right knee: Secondary | ICD-10-CM | POA: Diagnosis not present

## 2022-01-23 DIAGNOSIS — Z0289 Encounter for other administrative examinations: Secondary | ICD-10-CM | POA: Diagnosis not present

## 2022-01-23 DIAGNOSIS — F1190 Opioid use, unspecified, uncomplicated: Secondary | ICD-10-CM | POA: Diagnosis not present

## 2022-01-23 DIAGNOSIS — G8929 Other chronic pain: Secondary | ICD-10-CM | POA: Diagnosis not present

## 2022-01-23 DIAGNOSIS — M5030 Other cervical disc degeneration, unspecified cervical region: Secondary | ICD-10-CM | POA: Diagnosis not present

## 2022-01-23 DIAGNOSIS — M5136 Other intervertebral disc degeneration, lumbar region: Secondary | ICD-10-CM | POA: Diagnosis not present

## 2022-01-23 DIAGNOSIS — M961 Postlaminectomy syndrome, not elsewhere classified: Secondary | ICD-10-CM | POA: Diagnosis not present

## 2022-01-24 DIAGNOSIS — E559 Vitamin D deficiency, unspecified: Secondary | ICD-10-CM | POA: Diagnosis not present

## 2022-01-24 DIAGNOSIS — I2510 Atherosclerotic heart disease of native coronary artery without angina pectoris: Secondary | ICD-10-CM | POA: Diagnosis not present

## 2022-01-24 DIAGNOSIS — M2550 Pain in unspecified joint: Secondary | ICD-10-CM | POA: Diagnosis not present

## 2022-01-24 DIAGNOSIS — G4733 Obstructive sleep apnea (adult) (pediatric): Secondary | ICD-10-CM | POA: Diagnosis not present

## 2022-01-24 DIAGNOSIS — E1136 Type 2 diabetes mellitus with diabetic cataract: Secondary | ICD-10-CM | POA: Diagnosis not present

## 2022-01-24 DIAGNOSIS — Z955 Presence of coronary angioplasty implant and graft: Secondary | ICD-10-CM | POA: Diagnosis not present

## 2022-01-24 DIAGNOSIS — J449 Chronic obstructive pulmonary disease, unspecified: Secondary | ICD-10-CM | POA: Diagnosis not present

## 2022-01-24 DIAGNOSIS — E782 Mixed hyperlipidemia: Secondary | ICD-10-CM | POA: Diagnosis not present

## 2022-01-24 DIAGNOSIS — Z6829 Body mass index (BMI) 29.0-29.9, adult: Secondary | ICD-10-CM | POA: Diagnosis not present

## 2022-01-24 DIAGNOSIS — E1169 Type 2 diabetes mellitus with other specified complication: Secondary | ICD-10-CM | POA: Diagnosis not present

## 2022-01-24 DIAGNOSIS — I10 Essential (primary) hypertension: Secondary | ICD-10-CM | POA: Diagnosis not present

## 2022-01-31 DIAGNOSIS — N289 Disorder of kidney and ureter, unspecified: Secondary | ICD-10-CM | POA: Diagnosis not present

## 2022-02-12 DIAGNOSIS — H2512 Age-related nuclear cataract, left eye: Secondary | ICD-10-CM | POA: Diagnosis not present

## 2022-02-13 DIAGNOSIS — H2511 Age-related nuclear cataract, right eye: Secondary | ICD-10-CM | POA: Diagnosis not present

## 2022-03-05 DIAGNOSIS — H2511 Age-related nuclear cataract, right eye: Secondary | ICD-10-CM | POA: Diagnosis not present

## 2022-03-22 ENCOUNTER — Telehealth: Payer: Self-pay | Admitting: Cardiology

## 2022-03-22 NOTE — Telephone Encounter (Signed)
Left message for patient to call back  

## 2022-03-22 NOTE — Telephone Encounter (Signed)
Patient's wife is requesting an order for a stress test.  ?

## 2022-03-22 NOTE — Telephone Encounter (Signed)
Spoke with pt who ask who keep order these test as I have been cleared for surgery. I advised him that his wife had called and stated that the pt needed the test per the [place]. I told the pt he had one 5/22 at [facility] if he wanted to see if the [place] required an additional stress test. Pt will callback once he discusses with the surgeon. ?

## 2022-03-22 NOTE — Telephone Encounter (Signed)
Wife is calling back for results. Please advise ?

## 2022-03-22 NOTE — Telephone Encounter (Signed)
Patient's wife called advising that the patient needs to have a stress test prior to his total knee replacement in June per the anesthesiologist at the [facility]. He has already been cleared from a cardiology standpoint for the surgery. However, he does not have a cardiologist at the [facility] and is asking Dr. Geraldo Pitter to order a stress test for him. Will send this to Dr. Geraldo Pitter for advice. ?

## 2022-03-27 ENCOUNTER — Telehealth: Payer: Self-pay | Admitting: Cardiology

## 2022-03-27 DIAGNOSIS — I2510 Atherosclerotic heart disease of native coronary artery without angina pectoris: Secondary | ICD-10-CM

## 2022-03-27 DIAGNOSIS — Z01810 Encounter for preprocedural cardiovascular examination: Secondary | ICD-10-CM

## 2022-03-27 NOTE — Telephone Encounter (Signed)
Pt's wife is aware and lexiscan has been scheduled. Instructions have been mailed. ?

## 2022-03-27 NOTE — Addendum Note (Signed)
Addended by: Truddie Hidden on: 03/27/2022 05:06 PM ? ? Modules accepted: Orders ? ?

## 2022-03-27 NOTE — Telephone Encounter (Signed)
Spoke with Bethena Roys with the [place] who states that the pt will continue the aspirin. Looking at your note pt needs a stress test. Last stress test was 5/22 and cath was 6/13-6/14/23. ? ?Office note from 01/12/22 ? ?Preoperative evaluation: I told him that I would like to know from his orthopedic surgeon as to if his aspirin needs to be off for the surgery.  He will give his call and let us know who his orthopedic surgeon is.  He does not remember the name at this point.  If the aspirin is to be discontinued then I would not approve the surgery in view of high risk.  If the aspirin can be continued then I will do a stress Lexiscan sestamibi to assess his risk.  I discussed this with him and he vocalized understanding.  He is not keen on continuing dual antiplatelet therapy.  I respect his wishes.  Risks explained and he vocalized understanding. ?

## 2022-03-27 NOTE — Telephone Encounter (Signed)
? ?  Patient Name: Nicholas Love  ?DOB: [birth year] ?MRN: [id number] ? ?Primary Cardiologist: Jenean Lindau, MD ? ?Chart reviewed as part of pre-operative protocol coverage. Patient saw Dr. Geraldo Pitter 01/12/22 for preoperative evaluation. It appears there have since been discussions in additional phone notes whether patient will require a stress test prior to surgery. Since patient was evaluated by provider, separate preop APP input not needed at this time - per office protocol, the provider seeing this patient should forward their finalized clearance decision and recommendation on holding aspirin to requesting party below. Will remove from preop box. ? ?Charlie Pitter, PA-C ?03/27/2022, 2:51 PM ? ? ?

## 2022-03-27 NOTE — Telephone Encounter (Signed)
? ?  Pre-operative Risk Assessment  ?  ?Patient Name: Nicholas Love  ?DOB: February 20, 1956 ?MRN: [id number]  ? ?  ? ?Request for Surgical Clearance   ? ?Procedure:   Right Total Knee Replacement ? ?Date of Surgery:  Clearance 06/04/22                              ?   ?Surgeon:  Dr. Virgie Dad ?Surgeon's Group or Practice Name:  [facility] ?Phone number:  [phone] ?Fax number:  [phone] ?  ?Type of Clearance Requested:   ?- Pharmacy:  Hold Aspirin   ?  ?Type of Anesthesia:  General  ?  ?Additional requests/questions:   Can patient be cleared ? ?Signed, ?Belisicia T Harris   ?03/27/2022, 2:03 PM  ? ? ?

## 2022-03-28 ENCOUNTER — Telehealth (HOSPITAL_COMMUNITY): Payer: Self-pay | Admitting: *Deleted

## 2022-03-28 NOTE — Telephone Encounter (Signed)
Patient given detailed instructions per Myocardial Perfusion Study Information Sheet for the test on 04/04/22 Patient notified to arrive 15 minutes early and that it is imperative to arrive on time for appointment to keep from having the test rescheduled. ? If you need to cancel or reschedule your appointment, please call the office within 24 hours of your appointment. . Patient verbalized understanding. Diedra Sinor Jacqueline ? ? ?

## 2022-04-04 ENCOUNTER — Ambulatory Visit (INDEPENDENT_AMBULATORY_CARE_PROVIDER_SITE_OTHER): Payer: PPO

## 2022-04-04 DIAGNOSIS — I2510 Atherosclerotic heart disease of native coronary artery without angina pectoris: Secondary | ICD-10-CM | POA: Diagnosis not present

## 2022-04-04 DIAGNOSIS — Z01810 Encounter for preprocedural cardiovascular examination: Secondary | ICD-10-CM

## 2022-04-04 LAB — MYOCARDIAL PERFUSION IMAGING
LV dias vol: 136 mL (ref 62–150)
LV sys vol: 49 mL
Nuc Stress EF: 64 %
Peak HR: 74 {beats}/min
Rest HR: 54 {beats}/min
Rest Nuclear Isotope Dose: 10.4 mCi
SDS: 1
SRS: 1
SSS: 2
Stress Nuclear Isotope Dose: 28.4 mCi
TID: 1.09

## 2022-04-04 MED ORDER — TECHNETIUM TC 99M TETROFOSMIN IV KIT
10.4000 | PACK | Freq: Once | INTRAVENOUS | Status: AC | PRN
Start: 2022-04-04 — End: 2022-04-04
  Administered 2022-04-04: 10.4 via INTRAVENOUS

## 2022-04-04 MED ORDER — TECHNETIUM TC 99M TETROFOSMIN IV KIT
28.4000 | PACK | Freq: Once | INTRAVENOUS | Status: AC | PRN
  Administered 2022-04-04: 28.4 via INTRAVENOUS

## 2022-04-04 MED ORDER — REGADENOSON 0.4 MG/5ML IV SOLN
0.4000 mg | Freq: Once | INTRAVENOUS | Status: AC
Start: 2022-04-04 — End: 2022-04-04
  Administered 2022-04-04: 0.4 mg via INTRAVENOUS

## 2022-04-08 DIAGNOSIS — J449 Chronic obstructive pulmonary disease, unspecified: Secondary | ICD-10-CM | POA: Diagnosis not present

## 2022-04-08 DIAGNOSIS — I10 Essential (primary) hypertension: Secondary | ICD-10-CM | POA: Diagnosis not present

## 2022-04-08 DIAGNOSIS — E1136 Type 2 diabetes mellitus with diabetic cataract: Secondary | ICD-10-CM | POA: Diagnosis not present

## 2022-04-17 DIAGNOSIS — Z6828 Body mass index (BMI) 28.0-28.9, adult: Secondary | ICD-10-CM | POA: Diagnosis not present

## 2022-04-17 DIAGNOSIS — M5030 Other cervical disc degeneration, unspecified cervical region: Secondary | ICD-10-CM | POA: Diagnosis not present

## 2022-04-17 DIAGNOSIS — F1190 Opioid use, unspecified, uncomplicated: Secondary | ICD-10-CM | POA: Diagnosis not present

## 2022-04-17 DIAGNOSIS — M25561 Pain in right knee: Secondary | ICD-10-CM | POA: Diagnosis not present

## 2022-04-17 DIAGNOSIS — G8929 Other chronic pain: Secondary | ICD-10-CM | POA: Diagnosis not present

## 2022-04-17 DIAGNOSIS — M961 Postlaminectomy syndrome, not elsewhere classified: Secondary | ICD-10-CM | POA: Diagnosis not present

## 2022-04-17 DIAGNOSIS — M5136 Other intervertebral disc degeneration, lumbar region: Secondary | ICD-10-CM | POA: Diagnosis not present

## 2022-05-01 DIAGNOSIS — E782 Mixed hyperlipidemia: Secondary | ICD-10-CM | POA: Diagnosis not present

## 2022-05-01 DIAGNOSIS — N1831 Chronic kidney disease, stage 3a: Secondary | ICD-10-CM | POA: Diagnosis not present

## 2022-05-01 DIAGNOSIS — J0100 Acute maxillary sinusitis, unspecified: Secondary | ICD-10-CM | POA: Diagnosis not present

## 2022-05-01 DIAGNOSIS — E1136 Type 2 diabetes mellitus with diabetic cataract: Secondary | ICD-10-CM | POA: Diagnosis not present

## 2022-05-01 DIAGNOSIS — G4733 Obstructive sleep apnea (adult) (pediatric): Secondary | ICD-10-CM | POA: Diagnosis not present

## 2022-05-01 DIAGNOSIS — E1122 Type 2 diabetes mellitus with diabetic chronic kidney disease: Secondary | ICD-10-CM | POA: Diagnosis not present

## 2022-05-01 DIAGNOSIS — J449 Chronic obstructive pulmonary disease, unspecified: Secondary | ICD-10-CM | POA: Diagnosis not present

## 2022-05-01 DIAGNOSIS — I2510 Atherosclerotic heart disease of native coronary artery without angina pectoris: Secondary | ICD-10-CM | POA: Diagnosis not present

## 2022-05-01 DIAGNOSIS — I129 Hypertensive chronic kidney disease with stage 1 through stage 4 chronic kidney disease, or unspecified chronic kidney disease: Secondary | ICD-10-CM | POA: Diagnosis not present

## 2022-05-01 DIAGNOSIS — Z6828 Body mass index (BMI) 28.0-28.9, adult: Secondary | ICD-10-CM | POA: Diagnosis not present

## 2022-05-01 DIAGNOSIS — Z955 Presence of coronary angioplasty implant and graft: Secondary | ICD-10-CM | POA: Diagnosis not present

## 2022-05-15 ENCOUNTER — Telehealth: Payer: Self-pay | Admitting: Cardiology

## 2022-05-15 NOTE — Telephone Encounter (Signed)
Wife calling stating Nicholas Love has released Nicholas Love to have a total knee replacement.  The anesthesiologist is saying he doesn't have approval.  He wants a written letter that Nicholas Love has release him to have hit total knee replacement surgery.

## 2022-05-15 NOTE — Telephone Encounter (Signed)
Forwarded to preop pool

## 2022-05-16 NOTE — Telephone Encounter (Addendum)
    Name: Nicholas Love  DOB: 09-Jul-1956  MRN: [id number]  Primary Cardiologist: Jenean Lindau, MD       Patient Name: Nicholas Love  DOB: 11/27/1956 MRN: [id number]  Primary Cardiologist: Jenean Lindau, MD  Chart reviewed as part of pre-operative protocol coverage. Given past medical history and time since last visit, based on ACC/AHA guidelines, Nicholas Love would be at acceptable risk for the planned procedure without further cardiovascular testing. Patient was previously cleared for procedure on 05/15/2022 by Robbie Lis, PA.  Per prior encounter notes, patient may hold aspirin for 5 to 7 days prior to procedure.  Please resume aspirin as soon as possible postprocedure, at the discretion of the surgeon.  I will route this recommendation to the requesting party via Epic fax function and remove from pre-op pool.  Please call with questions.   Lenna Sciara, NP 05/16/2022, 3:46 PM [place] [address] [place], [place] [address]

## 2022-05-16 NOTE — Telephone Encounter (Signed)
Wife calling back to provide the person to fax info to.   Colin Benton fax [phone]

## 2022-05-17 NOTE — Telephone Encounter (Signed)
I s/w the pt's wife who tells me that the pt has already been cleared by DR. Revankar back in Feb and then again in March 2023 after the pt had stress test done. I reviewed the chart and I do see the notes that it seems like the pt has been cleared. I assured the pt's wife that I will d/w the pre op provider to clarify if the tele appt is still needed.   After speaking with pre op provider the pt will not need a tele pre op appt. Pre op provider will amend the note and we will be sure to fax all notes needed giving clearance.   I tried to call Colin Benton, RN to let her know that we will be faxing over notes to fax # given to our office [phone].

## 2022-05-17 NOTE — Telephone Encounter (Signed)
   Patient Name: Nicholas Love  DOB: 1956/02/07 MRN: [id number]  Primary Cardiologist: Jenean Lindau, MD  Chart reviewed as part of pre-operative protocol coverage.   Clearance faxed to Colin Benton, RN at [phone].  Please call with any questions.    Thank you.   Lenna Sciara, NP 05/17/2022, 4:26 PM

## 2022-05-17 NOTE — Telephone Encounter (Signed)
I will fax over notes requesting that we need a clearance request to be faxed to our office. Once I receive the clearance request we will be able to proceed. The pt will to set up a telephone visit with the pre op provider as well.  Please fax over a clearance request to fax # [phone] ATTN: PRE OP TEAM.   There is no phone # for me to call Nicholas Love. I only have a fax #.

## 2022-07-03 DIAGNOSIS — M5030 Other cervical disc degeneration, unspecified cervical region: Secondary | ICD-10-CM | POA: Diagnosis not present

## 2022-07-03 DIAGNOSIS — M25561 Pain in right knee: Secondary | ICD-10-CM | POA: Diagnosis not present

## 2022-07-03 DIAGNOSIS — E663 Overweight: Secondary | ICD-10-CM | POA: Diagnosis not present

## 2022-07-03 DIAGNOSIS — F1190 Opioid use, unspecified, uncomplicated: Secondary | ICD-10-CM | POA: Diagnosis not present

## 2022-07-03 DIAGNOSIS — G8929 Other chronic pain: Secondary | ICD-10-CM | POA: Diagnosis not present

## 2022-07-03 DIAGNOSIS — M5136 Other intervertebral disc degeneration, lumbar region: Secondary | ICD-10-CM | POA: Diagnosis not present

## 2022-07-03 DIAGNOSIS — M961 Postlaminectomy syndrome, not elsewhere classified: Secondary | ICD-10-CM | POA: Diagnosis not present

## 2022-09-24 ENCOUNTER — Telehealth: Payer: Self-pay | Admitting: *Deleted

## 2022-09-24 NOTE — Patient Outreach (Signed)
  Care Coordination   09/24/2022 Name: Nicholas Love MRN: [id number] DOB: December 15, 1955   Care Coordination Outreach Attempts:  An unsuccessful telephone outreach was attempted today to offer the patient information about available care coordination services as a benefit of their health plan.   Follow Up Plan:  No further outreach attempts will be made at this time. We have been unable to contact the patient to offer or enroll patient in care coordination services  Encounter Outcome:  No Answer  Care Coordination Interventions Activated:  No   Care Coordination Interventions:  No, not indicated    Jacqlyn Larsen [facility], BSN [facility] RN Care Coordinator [phone]

## 2022-09-28 DIAGNOSIS — N1831 Chronic kidney disease, stage 3a: Secondary | ICD-10-CM | POA: Diagnosis not present

## 2022-09-28 DIAGNOSIS — E782 Mixed hyperlipidemia: Secondary | ICD-10-CM | POA: Diagnosis not present

## 2022-09-28 DIAGNOSIS — I2510 Atherosclerotic heart disease of native coronary artery without angina pectoris: Secondary | ICD-10-CM | POA: Diagnosis not present

## 2022-09-28 DIAGNOSIS — M79641 Pain in right hand: Secondary | ICD-10-CM | POA: Diagnosis not present

## 2022-09-28 DIAGNOSIS — Z955 Presence of coronary angioplasty implant and graft: Secondary | ICD-10-CM | POA: Diagnosis not present

## 2022-09-28 DIAGNOSIS — I129 Hypertensive chronic kidney disease with stage 1 through stage 4 chronic kidney disease, or unspecified chronic kidney disease: Secondary | ICD-10-CM | POA: Diagnosis not present

## 2022-09-28 DIAGNOSIS — D649 Anemia, unspecified: Secondary | ICD-10-CM | POA: Diagnosis not present

## 2022-09-28 DIAGNOSIS — E1122 Type 2 diabetes mellitus with diabetic chronic kidney disease: Secondary | ICD-10-CM | POA: Diagnosis not present

## 2022-09-28 DIAGNOSIS — Z6830 Body mass index (BMI) 30.0-30.9, adult: Secondary | ICD-10-CM | POA: Diagnosis not present

## 2022-10-01 DIAGNOSIS — Z6830 Body mass index (BMI) 30.0-30.9, adult: Secondary | ICD-10-CM | POA: Diagnosis not present

## 2022-10-01 DIAGNOSIS — G8929 Other chronic pain: Secondary | ICD-10-CM | POA: Diagnosis not present

## 2022-10-01 DIAGNOSIS — M25561 Pain in right knee: Secondary | ICD-10-CM | POA: Diagnosis not present

## 2022-10-01 DIAGNOSIS — M5030 Other cervical disc degeneration, unspecified cervical region: Secondary | ICD-10-CM | POA: Diagnosis not present

## 2022-10-01 DIAGNOSIS — M961 Postlaminectomy syndrome, not elsewhere classified: Secondary | ICD-10-CM | POA: Diagnosis not present

## 2022-10-01 DIAGNOSIS — Z5181 Encounter for therapeutic drug level monitoring: Secondary | ICD-10-CM | POA: Diagnosis not present

## 2022-10-01 DIAGNOSIS — M5136 Other intervertebral disc degeneration, lumbar region: Secondary | ICD-10-CM | POA: Diagnosis not present

## 2022-10-01 DIAGNOSIS — F1190 Opioid use, unspecified, uncomplicated: Secondary | ICD-10-CM | POA: Diagnosis not present

## 2022-10-12 ENCOUNTER — Telehealth: Payer: Self-pay | Admitting: *Deleted

## 2022-10-12 NOTE — Patient Outreach (Signed)
  Care Coordination   10/12/2022 Name: Cap Massi Sankey MRN: [id number] DOB: [birth year]   Care Coordination Outreach Attempts:  A second unsuccessful outreach was attempted today to offer the patient with information about available care coordination services as a benefit of their health plan.     Follow Up Plan:  Additional outreach attempts will be made to offer the patient care coordination information and services.   Encounter Outcome:  No Answer  Care Coordination Interventions Activated:  No   Care Coordination Interventions:  No, not indicated    SIG Sharlee Rufino C. Myrtie Neither, MSN, [facility] Gerontological Nurse Practitioner [facility] Care Management [phone]

## 2022-10-25 ENCOUNTER — Telehealth: Payer: Self-pay | Admitting: *Deleted

## 2022-10-25 NOTE — Patient Outreach (Signed)
  Care Coordination   10/25/2022 Name: Nicholas Love MRN: [id number] DOB: January 12, 1956   Care Coordination Outreach Attempts:  A third unsuccessful outreach was attempted today to offer the patient with information about available care coordination services as a benefit of their health plan.   Follow Up Plan:  No further outreach attempts will be made at this time. We have been unable to contact the patient to offer or enroll patient in care coordination services  Encounter Outcome:  No Answer  Care Coordination Interventions Activated:  No   Care Coordination Interventions:  No, not indicated    SIG Jerelyn Trimarco C. Myrtie Neither, MSN, [facility] Gerontological Nurse Practitioner [facility] Care Management [phone]

## 2022-11-05 DIAGNOSIS — D509 Iron deficiency anemia, unspecified: Secondary | ICD-10-CM | POA: Diagnosis not present

## 2022-11-07 DIAGNOSIS — Z6829 Body mass index (BMI) 29.0-29.9, adult: Secondary | ICD-10-CM | POA: Diagnosis not present

## 2022-11-07 DIAGNOSIS — M5136 Other intervertebral disc degeneration, lumbar region: Secondary | ICD-10-CM | POA: Diagnosis not present

## 2022-11-07 DIAGNOSIS — F1190 Opioid use, unspecified, uncomplicated: Secondary | ICD-10-CM | POA: Diagnosis not present

## 2022-11-07 DIAGNOSIS — M961 Postlaminectomy syndrome, not elsewhere classified: Secondary | ICD-10-CM | POA: Diagnosis not present

## 2022-12-27 DIAGNOSIS — Z6830 Body mass index (BMI) 30.0-30.9, adult: Secondary | ICD-10-CM | POA: Diagnosis not present

## 2022-12-27 DIAGNOSIS — J0100 Acute maxillary sinusitis, unspecified: Secondary | ICD-10-CM | POA: Diagnosis not present

## 2022-12-27 DIAGNOSIS — M109 Gout, unspecified: Secondary | ICD-10-CM | POA: Diagnosis not present

## 2023-01-08 DIAGNOSIS — F1190 Opioid use, unspecified, uncomplicated: Secondary | ICD-10-CM | POA: Diagnosis not present

## 2023-01-08 DIAGNOSIS — M5030 Other cervical disc degeneration, unspecified cervical region: Secondary | ICD-10-CM | POA: Diagnosis not present

## 2023-01-08 DIAGNOSIS — M961 Postlaminectomy syndrome, not elsewhere classified: Secondary | ICD-10-CM | POA: Diagnosis not present

## 2023-01-08 DIAGNOSIS — Z6829 Body mass index (BMI) 29.0-29.9, adult: Secondary | ICD-10-CM | POA: Diagnosis not present

## 2023-01-08 DIAGNOSIS — M5136 Other intervertebral disc degeneration, lumbar region: Secondary | ICD-10-CM | POA: Diagnosis not present

## 2023-03-07 DIAGNOSIS — J329 Chronic sinusitis, unspecified: Secondary | ICD-10-CM | POA: Diagnosis not present

## 2023-04-09 DIAGNOSIS — Z6830 Body mass index (BMI) 30.0-30.9, adult: Secondary | ICD-10-CM | POA: Diagnosis not present

## 2023-04-09 DIAGNOSIS — M5030 Other cervical disc degeneration, unspecified cervical region: Secondary | ICD-10-CM | POA: Diagnosis not present

## 2023-04-09 DIAGNOSIS — E6609 Other obesity due to excess calories: Secondary | ICD-10-CM | POA: Diagnosis not present

## 2023-04-09 DIAGNOSIS — M5136 Other intervertebral disc degeneration, lumbar region: Secondary | ICD-10-CM | POA: Diagnosis not present

## 2023-04-09 DIAGNOSIS — Z79891 Long term (current) use of opiate analgesic: Secondary | ICD-10-CM | POA: Diagnosis not present

## 2023-04-09 DIAGNOSIS — M961 Postlaminectomy syndrome, not elsewhere classified: Secondary | ICD-10-CM | POA: Diagnosis not present

## 2023-04-09 DIAGNOSIS — Z760 Encounter for issue of repeat prescription: Secondary | ICD-10-CM | POA: Diagnosis not present

## 2023-04-09 DIAGNOSIS — Z79899 Other long term (current) drug therapy: Secondary | ICD-10-CM | POA: Diagnosis not present

## 2023-04-09 DIAGNOSIS — G8929 Other chronic pain: Secondary | ICD-10-CM | POA: Diagnosis not present

## 2023-04-09 DIAGNOSIS — M2550 Pain in unspecified joint: Secondary | ICD-10-CM | POA: Diagnosis not present

## 2023-04-17 DIAGNOSIS — M2550 Pain in unspecified joint: Secondary | ICD-10-CM | POA: Diagnosis not present

## 2023-04-17 DIAGNOSIS — D509 Iron deficiency anemia, unspecified: Secondary | ICD-10-CM | POA: Diagnosis not present

## 2023-04-17 DIAGNOSIS — G4733 Obstructive sleep apnea (adult) (pediatric): Secondary | ICD-10-CM | POA: Diagnosis not present

## 2023-04-17 DIAGNOSIS — Z125 Encounter for screening for malignant neoplasm of prostate: Secondary | ICD-10-CM | POA: Diagnosis not present

## 2023-04-17 DIAGNOSIS — E559 Vitamin D deficiency, unspecified: Secondary | ICD-10-CM | POA: Diagnosis not present

## 2023-04-17 DIAGNOSIS — I129 Hypertensive chronic kidney disease with stage 1 through stage 4 chronic kidney disease, or unspecified chronic kidney disease: Secondary | ICD-10-CM | POA: Diagnosis not present

## 2023-04-17 DIAGNOSIS — Z9181 History of falling: Secondary | ICD-10-CM | POA: Diagnosis not present

## 2023-04-17 DIAGNOSIS — E1122 Type 2 diabetes mellitus with diabetic chronic kidney disease: Secondary | ICD-10-CM | POA: Diagnosis not present

## 2023-04-17 DIAGNOSIS — Z1331 Encounter for screening for depression: Secondary | ICD-10-CM | POA: Diagnosis not present

## 2023-04-17 DIAGNOSIS — K219 Gastro-esophageal reflux disease without esophagitis: Secondary | ICD-10-CM | POA: Diagnosis not present

## 2023-04-17 DIAGNOSIS — J449 Chronic obstructive pulmonary disease, unspecified: Secondary | ICD-10-CM | POA: Diagnosis not present

## 2023-04-17 DIAGNOSIS — E782 Mixed hyperlipidemia: Secondary | ICD-10-CM | POA: Diagnosis not present

## 2023-04-17 DIAGNOSIS — E1136 Type 2 diabetes mellitus with diabetic cataract: Secondary | ICD-10-CM | POA: Diagnosis not present

## 2023-04-17 DIAGNOSIS — Z139 Encounter for screening, unspecified: Secondary | ICD-10-CM | POA: Diagnosis not present

## 2023-04-17 DIAGNOSIS — W57XXXA Bitten or stung by nonvenomous insect and other nonvenomous arthropods, initial encounter: Secondary | ICD-10-CM | POA: Diagnosis not present

## 2023-07-09 DIAGNOSIS — M961 Postlaminectomy syndrome, not elsewhere classified: Secondary | ICD-10-CM | POA: Diagnosis not present

## 2023-07-09 DIAGNOSIS — M0579 Rheumatoid arthritis with rheumatoid factor of multiple sites without organ or systems involvement: Secondary | ICD-10-CM | POA: Diagnosis not present

## 2023-07-09 DIAGNOSIS — F1190 Opioid use, unspecified, uncomplicated: Secondary | ICD-10-CM | POA: Diagnosis not present

## 2023-07-09 DIAGNOSIS — M25561 Pain in right knee: Secondary | ICD-10-CM | POA: Diagnosis not present

## 2023-07-09 DIAGNOSIS — E669 Obesity, unspecified: Secondary | ICD-10-CM | POA: Diagnosis not present

## 2023-07-09 DIAGNOSIS — M5136 Other intervertebral disc degeneration, lumbar region: Secondary | ICD-10-CM | POA: Diagnosis not present

## 2023-07-09 DIAGNOSIS — Z79891 Long term (current) use of opiate analgesic: Secondary | ICD-10-CM | POA: Diagnosis not present

## 2023-07-09 DIAGNOSIS — G8929 Other chronic pain: Secondary | ICD-10-CM | POA: Diagnosis not present

## 2023-07-09 DIAGNOSIS — Z5181 Encounter for therapeutic drug level monitoring: Secondary | ICD-10-CM | POA: Diagnosis not present

## 2023-07-22 DIAGNOSIS — I129 Hypertensive chronic kidney disease with stage 1 through stage 4 chronic kidney disease, or unspecified chronic kidney disease: Secondary | ICD-10-CM | POA: Diagnosis not present

## 2023-07-22 DIAGNOSIS — I2510 Atherosclerotic heart disease of native coronary artery without angina pectoris: Secondary | ICD-10-CM | POA: Diagnosis not present

## 2023-07-22 DIAGNOSIS — E1122 Type 2 diabetes mellitus with diabetic chronic kidney disease: Secondary | ICD-10-CM | POA: Diagnosis not present

## 2023-07-22 DIAGNOSIS — E782 Mixed hyperlipidemia: Secondary | ICD-10-CM | POA: Diagnosis not present

## 2023-07-22 DIAGNOSIS — K219 Gastro-esophageal reflux disease without esophagitis: Secondary | ICD-10-CM | POA: Diagnosis not present

## 2023-07-22 DIAGNOSIS — E559 Vitamin D deficiency, unspecified: Secondary | ICD-10-CM | POA: Diagnosis not present

## 2023-08-23 ENCOUNTER — Encounter: Payer: Self-pay | Admitting: Cardiology

## 2023-08-23 ENCOUNTER — Ambulatory Visit: Payer: No Typology Code available for payment source | Attending: Cardiology | Admitting: Cardiology

## 2023-08-23 VITALS — BP 160/88 | HR 72 | Ht 72.0 in | Wt 233.8 lb

## 2023-08-23 DIAGNOSIS — I25110 Atherosclerotic heart disease of native coronary artery with unstable angina pectoris: Secondary | ICD-10-CM | POA: Diagnosis not present

## 2023-08-23 DIAGNOSIS — I200 Unstable angina: Secondary | ICD-10-CM

## 2023-08-23 DIAGNOSIS — I10 Essential (primary) hypertension: Secondary | ICD-10-CM

## 2023-08-23 DIAGNOSIS — I2510 Atherosclerotic heart disease of native coronary artery without angina pectoris: Secondary | ICD-10-CM

## 2023-08-23 MED ORDER — FUROSEMIDE 40 MG PO TABS: 40.0000 mg | ORAL_TABLET | Freq: Every day | ORAL | 3 refills | Status: DC

## 2023-08-23 NOTE — Progress Notes (Signed)
Cardiology Office Note:    Date:  08/23/2023   ID:  Nicholas Love, DOB July 12, 1956, MRN [id number]  PCP:  Jim Like, NP  Cardiologist:  Garwin Brothers, MD   Referring MD: Gordy Councilman, FNP    ASSESSMENT:    1. Coronary artery disease involving native coronary artery of native heart without angina pectoris   2. Essential hypertension   3. Unstable angina (HCC)    PLAN:    In order of problems listed above:  Coronary artery disease: Secondary prevention stressed with the patient.  Importance of compliance with diet medication stressed any vocalized understanding. Essential hypertension and pedal edema: Blood pressure is elevated and this may be due to his pain.  At any rate because of pedal edema I will add 40 mg of Lasix to his regimen he will be back in 1 week and will bring pulse blood pressure log and get a Chem-7 on that day. Mixed dyslipidemia: Lipids not at goal.  I discussed with the patient extensively lifestyle modification we will recheck blood work in 6 weeks. Obesity: Weight reduction stressed diet emphasized and he promises to do better. Diabetes mellitus: Managed by primary care.  Diet emphasized. Patient will be seen in follow-up appointment in 6 months or earlier if the patient has any concerns.    Medication Adjustments/Labs and Tests Ordered: Current medicines are reviewed at length with the patient today.  Concerns regarding medicines are outlined above.  Orders Placed This Encounter  Procedures   EKG 12-Lead   No orders of the defined types were placed in this encounter.    No chief complaint on file.    History of Present Illness:    Nicholas Love is a 67 y.o. male.  Patient has past medical history of coronary artery disease, essential hypertension, mixed dyslipidemia and recently diagnosed diabetes.  He has significant rheumatoid arthritis and is planning to be on methotrexate.  He denies any chest pain orthopnea or PND.  He has bilateral  pedal edema.  He says since his many years ago he has pedal edema.  Is getting worse.  At the time of my evaluation, the patient is alert awake oriented and in no distress.  Past Medical History:  Diagnosis Date   Angina pectoris (HCC)    Arthritis    Asthma    Barrett esophagus    Bilateral primary osteoarthritis of knee 05/16/2021   Bronchiectasis (HCC)    CAD (coronary artery disease)    a. prior mild RCAx2 and LAD stenting. b. same day PCI DES to dRCA 12/2017, 100% occ oD1 with L-L collaterals, otherwise nonobstructive disease, EF normal.   Chronic obstructive pulmonary disease (HCC) 12/17/2016   Formatting of this note might be different from the original. PFTs 01/30/17 moderate obstruction Ratio 69%, FEV1 76%, FVC 85%   Chronic pain of right knee 06/20/2021   Last Assessment & Plan:  Formatting of this note might be different from the original. Review of imaging notes severe degenerative changes and MRI identified insufficiency fractures of the femoral condyle.  He is following with Orthopedics and considering total knee arthroplasty.   Chronic, continuous use of opioids 10/17/2021   Last Assessment & Plan:  Formatting of this note might be different from the original. Patient and I have discussed the hazardous effects of continued opiate pain medication usage. Risks and benefits of above medications including but not limited to possibility of respiratory depression, sedation, and even death were discussed with the patient  who expressed an understanding.  Patient did not displ   COPD (chronic obstructive pulmonary disease) (HCC)    Coronary artery disease    DDD (degenerative disc disease), cervical 06/20/2021   Last Assessment & Plan:  Formatting of this note might be different from the original. Cervical imaging shows vertebral body height maintained, approximately 2 mm anterolisthesis of C5 on 6.  There is moderate degenerative disc height loss at C5-6.  Slight bony neuroforaminal narrowing more  advanced on the right.  At this time low back and right knee pain or more limiting complaints.   DDD (degenerative disc disease), lumbar 06/20/2021   Last Assessment & Plan:  Formatting of this note might be different from the original. See lumbar post laminectomy plan   Essential hypertension 01/17/2018   GERD (gastroesophageal reflux disease)    Hiatal hernia    Hyperlipemia    Hypertension    Lumbar post-laminectomy syndrome 06/20/2021   Last Assessment & Plan:  Formatting of this note might be different from the original. This is a 67 year old male who is new to the clinic today with chronic low back pain, status post lumbosacral fusion.  Review of lumbar imaging identifies some adjacent segment degeneration with notable disc height loss at L1-2 level, above his previous L2 to the sacrum fusion.    Today we will take over his chr   Mixed dyslipidemia 01/17/2018   Myocardial infarct [facility])    Pain medication agreement 06/20/2021   Last Assessment & Plan:  Formatting of this note might be different from the original. Agreement signed today.  Narcan prescribed.   Sleep apnea    SOB (shortness of breath)    Unstable angina (HCC) 05/22/2021    Past Surgical History:  Procedure Laterality Date   arthroscopic knee     BACK SURGERY     x3 lower back   CARDIAC CATHETERIZATION     3 stents   CORONARY BALLOON ANGIOPLASTY N/A 05/22/2021   Procedure: CORONARY BALLOON ANGIOPLASTY;  Surgeon: Lennette Bihari, MD;  Location: [facility] INVASIVE CV LAB;  Service: Cardiovascular;  Laterality: N/A;  PTCA   ABORTED   CORONARY BALLOON ANGIOPLASTY N/A 05/23/2021   Procedure: CORONARY BALLOON ANGIOPLASTY;  Surgeon: Lennette Bihari, MD;  Location: [facility] INVASIVE CV LAB;  Service: Cardiovascular;  Laterality: N/A;   CORONARY STENT INTERVENTION N/A 12/20/2017   Procedure: CORONARY STENT INTERVENTION;  Surgeon: Swaziland, Peter M, MD;  Location: [facility] INVASIVE CV LAB;  Service: Cardiovascular;  Laterality: N/A;   FRACTURE SURGERY   1970's   ankle   LEFT HEART CATH AND CORONARY ANGIOGRAPHY N/A 12/20/2017   Procedure: LEFT HEART CATH AND CORONARY ANGIOGRAPHY;  Surgeon: Swaziland, Peter M, MD;  Location: [facility] INVASIVE CV LAB;  Service: Cardiovascular;  Laterality: N/A;   LEFT HEART CATH AND CORONARY ANGIOGRAPHY N/A 05/22/2021   Procedure: LEFT HEART CATH AND CORONARY ANGIOGRAPHY;  Surgeon: Lennette Bihari, MD;  Location: [facility] INVASIVE CV LAB;  Service: Cardiovascular;  Laterality: N/A;    Current Medications: Current Meds  Medication Sig   amLODipine (NORVASC) 5 MG tablet Take 5 mg by mouth daily.   aspirin EC 81 MG tablet Take 81 mg by mouth every evening.    Budeson-Glycopyrrol-Formoterol (BREZTRI AEROSPHERE) 160-9-4.8 MCG/ACT AERO Inhale 2 puffs into the lungs 2 (two) times daily.   etanercept (ENBREL) 50 MG/ML injection Inject 50 mg into the skin once a week.   ferrous sulfate 325 (65 FE) MG EC tablet Take 325 mg by mouth daily with  breakfast.   folic acid (FOLVITE) 800 MCG tablet Take 400 mcg by mouth 2 (two) times daily.   metFORMIN (GLUCOPHAGE) 500 MG tablet Take 500 mg by mouth daily with breakfast.   methotrexate (RHEUMATREX) 2.5 MG tablet Take 2.5 mg by mouth once a week. Caution:Chemotherapy. Protect from light.   metoprolol succinate (TOPROL-XL) 100 MG 24 hr tablet Take 100 mg by mouth in the morning and at bedtime. Take with or immediately following a meal.   mirtazapine (REMERON) 15 MG tablet Take 15 mg by mouth at bedtime.   nitroGLYCERIN (NITROSTAT) 0.4 MG SL tablet Place 0.4 mg under the tongue every 5 (five) minutes x 3 doses as needed for chest pain.   Oxycodone HCl 10 MG TABS Take 10 mg by mouth every 4 (four) hours as needed for pain.   predniSONE (DELTASONE) 10 MG tablet Take 10-20 mg by mouth as needed.   rosuvastatin (CRESTOR) 40 MG tablet Take 40 mg by mouth every evening.    tiZANidine (ZANAFLEX) 4 MG tablet Take 4 mg by mouth at bedtime.      Allergies:   Ceftriaxone, Cefdinir, Ceftin [cefuroxime],  and Ciprofloxacin   Social History   Socioeconomic History   Marital status: Married    Spouse name: Not on file   Number of children: Not on file   Years of education: Not on file   Highest education level: Not on file  Occupational History   Not on file  Tobacco Use   Smoking status: Never   Smokeless tobacco: Never  Substance and Sexual Activity   Alcohol use: Not on file   Drug use: Not on file   Sexual activity: Not on file  Other Topics Concern   Not on file  Social History Narrative   Not on file   Social Determinants of Health   Financial Resource Strain: Not on file  Food Insecurity: Not on file  Transportation Needs: Not on file  Physical Activity: Not on file  Stress: Not on file  Social Connections: Not on file     Family History: The patient's Family history is unknown by patient.  ROS:   Please see the history of present illness.    All other systems reviewed and are negative.  EKGs/Labs/Other Studies Reviewed:    The following studies were reviewed today: .Marland KitchenEKG Interpretation Date/Time:  Friday August 23 2023 16:04:51 EDT Ventricular Rate:  72 PR Interval:  164 QRS Duration:  108 QT Interval:  408 QTC Calculation: 446 R Axis:   -29  Text Interpretation: Normal sinus rhythm Septal infarct , age undetermined Abnormal ECG When compared with ECG of 24-May-2021 06:55, QRS duration has increased Septal infarct is now Present ST elevation now present in Lateral leads Nonspecific T wave abnormality no longer evident in Lateral leads Confirmed by Belva Crome [phone]) on 08/23/2023 4:11:48 PM     Recent Labs: No results found for requested labs within last 365 days.  Recent Lipid Panel    Component Value Date/Time   CHOL 133 05/24/2021 0220   CHOL 184 01/17/2018 0923   TRIG 238 (H) 05/24/2021 0220   HDL 32 (L) 05/24/2021 0220   HDL 42 01/17/2018 0923   CHOLHDL 4.2 05/24/2021 0220   VLDL 48 (H) 05/24/2021 0220   LDLCALC 53 05/24/2021 0220    LDLCALC 117 (H) 01/17/2018 0923    Physical Exam:    VS:  BP (!) 160/88 (BP Location: Left Arm, Patient Position: Sitting, Cuff Size: Normal)   Pulse 72  Ht 6' (1.829 m)   Wt 233 lb 12.8 oz (106.1 kg)   SpO2 92%   BMI 31.71 kg/m     Wt Readings from Last 3 Encounters:  08/23/23 233 lb 12.8 oz (106.1 kg)  04/04/22 223 lb (101.2 kg)  01/12/22 223 lb 6.4 oz (101.3 kg)     GEN: Patient is in no acute distress HEENT: Normal NECK: No JVD; No carotid bruits LYMPHATICS: No lymphadenopathy CARDIAC: Hear sounds regular, 2/6 systolic murmur at the apex. RESPIRATORY:  Clear to auscultation without rales, wheezing or rhonchi  ABDOMEN: Soft, non-tender, non-distended MUSCULOSKELETAL:  No edema; No deformity  SKIN: Warm and dry NEUROLOGIC:  Alert and oriented x 3 PSYCHIATRIC:  Normal affect   Signed, Garwin Brothers, MD  08/23/2023 4:13 PM    [facility]

## 2023-08-23 NOTE — Addendum Note (Signed)
Addended by: Roxanne Mins I on: 08/23/2023 05:42 PM   Modules accepted: Orders

## 2023-08-23 NOTE — Addendum Note (Signed)
Addended by: Roxanne Mins I on: 08/23/2023 04:47 PM   Modules accepted: Orders

## 2023-08-23 NOTE — Patient Instructions (Signed)
Medication Instructions:  Your physician has recommended you make the following change in your medication:   START: Lasix 40 mg daily  *If you need a refill on your cardiac medications before your next appointment, please call your pharmacy*   Lab Work: Your physician recommends that you return for lab work in:   Labs in 3 months: BMP, LFT, Lipids  Labs in 1 week: BMP   If you have labs (blood work) drawn today and your tests are completely normal, you will receive your results only by: MyChart Message (if you have MyChart) OR A paper copy in the mail If you have any lab test that is abnormal or we need to change your treatment, we will call you to review the results.   Testing/Procedures: Your physician has requested that you have an echocardiogram. Echocardiography is a painless test that uses sound waves to create images of your heart. It provides your doctor with information about the size and shape of your heart and how well your heart's chambers and valves are working. This procedure takes approximately one hour. There are no restrictions for this procedure. Please do NOT wear cologne, perfume, aftershave, or lotions (deodorant is allowed). Please arrive 15 minutes prior to your appointment time.    Follow-Up: At [facility], you and your health needs are our priority.  As part of our continuing mission to provide you with exceptional heart care, we have created designated Provider Care Teams.  These Care Teams include your primary Cardiologist (physician) and Advanced Practice Providers (APPs -  Physician Assistants and Nurse Practitioners) who all work together to provide you with the care you need, when you need it.  We recommend signing up for the patient portal called "MyChart".  Sign up information is provided on this After Visit Summary.  MyChart is used to connect with patients for Virtual Visits (Telemedicine).  Patients are able to view lab/test results,  encounter notes, upcoming appointments, etc.  Non-urgent messages can be sent to your provider as well.   To learn more about what you can do with MyChart, go to [email or web address].    Your next appointment:   6 month(s)  Provider:   Belva Crome, MD    Other Instructions None

## 2023-08-29 ENCOUNTER — Telehealth: Payer: Self-pay

## 2023-08-29 ENCOUNTER — Ambulatory Visit: Payer: No Typology Code available for payment source | Attending: Cardiology

## 2023-08-29 DIAGNOSIS — I10 Essential (primary) hypertension: Secondary | ICD-10-CM | POA: Diagnosis not present

## 2023-08-29 NOTE — Progress Notes (Addendum)
Nurse Visit   Date of Encounter: 08/29/2023 ID: Nicholas Love, DOB July 09, 1956, MRN [id number]  PCP:  Jim Like, NP   [facility] Providers Cardiologist:  Garwin Brothers, MD      Visit Details   VS:  BP (!) 164/80 (BP Location: Left Arm, Patient Position: Sitting)   Pulse (!) 54   Ht 6' (1.829 m)   Wt 229 lb 9.6 oz (104.1 kg)   SpO2 90%   BMI 31.14 kg/m  , BMI Body mass index is 31.14 kg/m.  Wt Readings from Last 3 Encounters:  08/29/23 229 lb 9.6 oz (104.1 kg)  08/23/23 233 lb 12.8 oz (106.1 kg)  04/04/22 223 lb (101.2 kg)     Reason for visit: Follow up per Dr. Tomie China on BP & Pulse with BP log Performed today: Vitals, BP log Changes (medications, testing, etc.) : Increase Amlodipine to 10mg  daily and bring BP log by in 1 week - per Dr. Tomie China. Pt notified. Length of Visit: 15 minutes Will send to Dr. Tomie China for review    Medications Adjustments/Labs and Tests Ordered: No orders of the defined types were placed in this encounter.  No orders of the defined types were placed in this encounter.    Signed, Neena Rhymes, RN  08/29/2023 10:21 AM

## 2023-08-29 NOTE — Telephone Encounter (Signed)
Called pt. LVM per Dr. Kem Parkinson note to increase Amlodipine to 10mg  daily and to bring BP log by in 1 week.

## 2023-09-18 ENCOUNTER — Ambulatory Visit: Payer: No Typology Code available for payment source | Admitting: Cardiology

## 2023-09-24 ENCOUNTER — Other Ambulatory Visit: Payer: Self-pay

## 2023-09-24 ENCOUNTER — Telehealth: Payer: Self-pay | Admitting: Cardiology

## 2023-09-24 MED ORDER — AMLODIPINE BESYLATE 5 MG PO TABS: 10.0000 mg | ORAL_TABLET | Freq: Every day | ORAL | 3 refills | Status: DC

## 2023-09-24 NOTE — Telephone Encounter (Signed)
*  STAT* If patient is at the pharmacy, call can be transferred to refill team.   1. Which medications need to be refilled? (please list name of each medication and dose if known) amLODipine (NORVASC) 5 MG tablet   2. Which pharmacy/location (including street and city if local pharmacy) is medication to be sent to? [facility] [place] [address]    3. Do they need a 30 day or 90 day supply?  90 day supply

## 2023-09-26 ENCOUNTER — Ambulatory Visit: Payer: No Typology Code available for payment source | Attending: Cardiology

## 2023-09-26 DIAGNOSIS — I25110 Atherosclerotic heart disease of native coronary artery with unstable angina pectoris: Secondary | ICD-10-CM

## 2023-09-26 DIAGNOSIS — I10 Essential (primary) hypertension: Secondary | ICD-10-CM

## 2023-09-26 DIAGNOSIS — I200 Unstable angina: Secondary | ICD-10-CM

## 2023-09-26 DIAGNOSIS — I2510 Atherosclerotic heart disease of native coronary artery without angina pectoris: Secondary | ICD-10-CM

## 2023-09-26 LAB — ECHOCARDIOGRAM COMPLETE
AR max vel: 2.29 cm2
AV Area VTI: 2.46 cm2
AV Area mean vel: 2.33 cm2
AV Mean grad: 11 mm[Hg]
AV Peak grad: 19.8 mm[Hg]
Ao pk vel: 2.23 m/s
Area-P 1/2: 2.84 cm2
MV VTI: 2.33 cm2
P 1/2 time: 416 ms
S' Lateral: 3.4 cm

## 2023-09-30 ENCOUNTER — Telehealth: Payer: Self-pay

## 2023-09-30 NOTE — Telephone Encounter (Signed)
Left vm to return call.    

## 2023-09-30 NOTE — Telephone Encounter (Signed)
-----   Message from Aundra Dubin Revankar sent at 09/28/2023 11:10 AM EDT ----- Mild AS, medical management otherwiseThe results of the study is unremarkable. Please inform patient. I will discuss in detail at next appointment. Cc  primary care/referring physician Garwin Brothers, MD 09/28/2023 11:09 AM

## 2023-10-02 ENCOUNTER — Telehealth: Payer: Self-pay | Admitting: Cardiology

## 2023-10-02 DIAGNOSIS — M069 Rheumatoid arthritis, unspecified: Secondary | ICD-10-CM | POA: Diagnosis not present

## 2023-10-02 NOTE — Telephone Encounter (Signed)
Left vm to return call.  Mild AS, medical management

## 2023-10-02 NOTE — Telephone Encounter (Signed)
Patient's wife is returning phone call in regards to Echo results. Patient's wife is requesting we call the patient at [phone]. Please advise.

## 2023-10-03 NOTE — Telephone Encounter (Signed)
Patient informed of results.  

## 2023-10-08 DIAGNOSIS — M51362 Other intervertebral disc degeneration, lumbar region with discogenic back pain and lower extremity pain: Secondary | ICD-10-CM | POA: Diagnosis not present

## 2023-10-08 DIAGNOSIS — M5030 Other cervical disc degeneration, unspecified cervical region: Secondary | ICD-10-CM | POA: Diagnosis not present

## 2023-10-08 DIAGNOSIS — E669 Obesity, unspecified: Secondary | ICD-10-CM | POA: Diagnosis not present

## 2023-10-08 DIAGNOSIS — F1190 Opioid use, unspecified, uncomplicated: Secondary | ICD-10-CM | POA: Diagnosis not present

## 2023-10-08 DIAGNOSIS — Z79891 Long term (current) use of opiate analgesic: Secondary | ICD-10-CM | POA: Diagnosis not present

## 2023-10-08 DIAGNOSIS — M961 Postlaminectomy syndrome, not elsewhere classified: Secondary | ICD-10-CM | POA: Diagnosis not present

## 2023-10-08 DIAGNOSIS — M0579 Rheumatoid arthritis with rheumatoid factor of multiple sites without organ or systems involvement: Secondary | ICD-10-CM | POA: Diagnosis not present

## 2023-10-23 DIAGNOSIS — J449 Chronic obstructive pulmonary disease, unspecified: Secondary | ICD-10-CM | POA: Diagnosis not present

## 2023-10-23 DIAGNOSIS — G4730 Sleep apnea, unspecified: Secondary | ICD-10-CM | POA: Diagnosis not present

## 2023-10-23 DIAGNOSIS — K219 Gastro-esophageal reflux disease without esophagitis: Secondary | ICD-10-CM | POA: Diagnosis not present

## 2023-10-23 DIAGNOSIS — E559 Vitamin D deficiency, unspecified: Secondary | ICD-10-CM | POA: Diagnosis not present

## 2023-10-23 DIAGNOSIS — G4733 Obstructive sleep apnea (adult) (pediatric): Secondary | ICD-10-CM | POA: Diagnosis not present

## 2023-10-23 DIAGNOSIS — E782 Mixed hyperlipidemia: Secondary | ICD-10-CM | POA: Diagnosis not present

## 2023-10-23 DIAGNOSIS — E1122 Type 2 diabetes mellitus with diabetic chronic kidney disease: Secondary | ICD-10-CM | POA: Diagnosis not present

## 2023-10-23 DIAGNOSIS — I129 Hypertensive chronic kidney disease with stage 1 through stage 4 chronic kidney disease, or unspecified chronic kidney disease: Secondary | ICD-10-CM | POA: Diagnosis not present

## 2023-10-23 DIAGNOSIS — M069 Rheumatoid arthritis, unspecified: Secondary | ICD-10-CM | POA: Diagnosis not present

## 2024-01-01 ENCOUNTER — Telehealth: Payer: Self-pay | Admitting: Cardiology

## 2024-01-01 NOTE — Telephone Encounter (Signed)
  Pt 's wife requesting to speak with Dr. Kem Parkinson nurse to discuss the pt's re-certifying his community health care

## 2024-01-02 DIAGNOSIS — Z6830 Body mass index (BMI) 30.0-30.9, adult: Secondary | ICD-10-CM | POA: Diagnosis not present

## 2024-01-02 DIAGNOSIS — B351 Tinea unguium: Secondary | ICD-10-CM | POA: Diagnosis not present

## 2024-01-02 DIAGNOSIS — M069 Rheumatoid arthritis, unspecified: Secondary | ICD-10-CM | POA: Diagnosis not present

## 2024-01-02 NOTE — Telephone Encounter (Signed)
Spoke with pts spouse, per DPR. She stated that the [place] said we needed to re-certify the pt for [facility] through the [place]. Per Hayden Rasmussen will send to Jonette Pesa for further direction.

## 2024-01-07 DIAGNOSIS — M0579 Rheumatoid arthritis with rheumatoid factor of multiple sites without organ or systems involvement: Secondary | ICD-10-CM | POA: Diagnosis not present

## 2024-01-07 DIAGNOSIS — M51362 Other intervertebral disc degeneration, lumbar region with discogenic back pain and lower extremity pain: Secondary | ICD-10-CM | POA: Diagnosis not present

## 2024-01-07 DIAGNOSIS — M961 Postlaminectomy syndrome, not elsewhere classified: Secondary | ICD-10-CM | POA: Diagnosis not present

## 2024-01-07 DIAGNOSIS — F1190 Opioid use, unspecified, uncomplicated: Secondary | ICD-10-CM | POA: Diagnosis not present

## 2024-01-07 DIAGNOSIS — Z5181 Encounter for therapeutic drug level monitoring: Secondary | ICD-10-CM | POA: Diagnosis not present

## 2024-01-07 DIAGNOSIS — Z79891 Long term (current) use of opiate analgesic: Secondary | ICD-10-CM | POA: Diagnosis not present

## 2024-01-07 DIAGNOSIS — G8929 Other chronic pain: Secondary | ICD-10-CM | POA: Diagnosis not present

## 2024-01-07 DIAGNOSIS — M25511 Pain in right shoulder: Secondary | ICD-10-CM | POA: Diagnosis not present

## 2024-01-07 DIAGNOSIS — M5030 Other cervical disc degeneration, unspecified cervical region: Secondary | ICD-10-CM | POA: Diagnosis not present

## 2024-01-07 DIAGNOSIS — M25512 Pain in left shoulder: Secondary | ICD-10-CM | POA: Diagnosis not present

## 2024-01-07 DIAGNOSIS — E669 Obesity, unspecified: Secondary | ICD-10-CM | POA: Diagnosis not present

## 2024-01-28 DIAGNOSIS — G4733 Obstructive sleep apnea (adult) (pediatric): Secondary | ICD-10-CM | POA: Diagnosis not present

## 2024-01-28 DIAGNOSIS — Z125 Encounter for screening for malignant neoplasm of prostate: Secondary | ICD-10-CM | POA: Diagnosis not present

## 2024-01-28 DIAGNOSIS — E559 Vitamin D deficiency, unspecified: Secondary | ICD-10-CM | POA: Diagnosis not present

## 2024-01-28 DIAGNOSIS — E1122 Type 2 diabetes mellitus with diabetic chronic kidney disease: Secondary | ICD-10-CM | POA: Diagnosis not present

## 2024-01-28 DIAGNOSIS — I129 Hypertensive chronic kidney disease with stage 1 through stage 4 chronic kidney disease, or unspecified chronic kidney disease: Secondary | ICD-10-CM | POA: Diagnosis not present

## 2024-01-28 DIAGNOSIS — K219 Gastro-esophageal reflux disease without esophagitis: Secondary | ICD-10-CM | POA: Diagnosis not present

## 2024-01-28 DIAGNOSIS — Z6830 Body mass index (BMI) 30.0-30.9, adult: Secondary | ICD-10-CM | POA: Diagnosis not present

## 2024-01-28 DIAGNOSIS — J449 Chronic obstructive pulmonary disease, unspecified: Secondary | ICD-10-CM | POA: Diagnosis not present

## 2024-01-28 DIAGNOSIS — E782 Mixed hyperlipidemia: Secondary | ICD-10-CM | POA: Diagnosis not present

## 2024-01-28 DIAGNOSIS — M069 Rheumatoid arthritis, unspecified: Secondary | ICD-10-CM | POA: Diagnosis not present

## 2024-03-04 ENCOUNTER — Other Ambulatory Visit: Payer: Self-pay

## 2024-03-11 ENCOUNTER — Encounter: Payer: Self-pay | Admitting: Cardiology

## 2024-03-11 ENCOUNTER — Ambulatory Visit: Payer: No Typology Code available for payment source | Attending: Cardiology | Admitting: Cardiology

## 2024-03-11 VITALS — BP 138/64 | HR 56 | Ht 70.6 in | Wt 225.4 lb

## 2024-03-11 DIAGNOSIS — I10 Essential (primary) hypertension: Secondary | ICD-10-CM

## 2024-03-11 DIAGNOSIS — I2510 Atherosclerotic heart disease of native coronary artery without angina pectoris: Secondary | ICD-10-CM

## 2024-03-11 DIAGNOSIS — E782 Mixed hyperlipidemia: Secondary | ICD-10-CM | POA: Diagnosis not present

## 2024-03-11 DIAGNOSIS — E66811 Obesity, class 1: Secondary | ICD-10-CM

## 2024-03-11 HISTORY — DX: Obesity, class 1: E66.811

## 2024-03-11 NOTE — Progress Notes (Signed)
 Cardiology Office Note:    Date:  03/11/2024   ID:  Nicholas Love, DOB 1956/11/03, MRN [id number]  PCP:  Jim Like, NP  Cardiologist:  Garwin Brothers, MD   Referring MD: Jim Like, NP    ASSESSMENT:    1. Coronary artery disease involving native coronary artery of native heart without angina pectoris   2. Essential hypertension   3. Mixed dyslipidemia   4. Obesity (BMI 30.0-34.9)    PLAN:    In order of problems listed above:  Coronary artery disease: Secondary prevention stressed to the patient.  Importance of compliance with diet medication stressed any vocalized understanding.  He was advised to ambulate to the best of his ability. Essential hypertension: Blood pressure is stable and diet was emphasized.  Salt intake issues were discussed. Mixed dyslipidemia: On lipid-lowering medications followed by primary care.  Lipids not at goal.  I suggested as adding Zetia to his regimen.  He is not keen on it but he wants to diet and do better.  I respect his wishes.  Goal LDL less than 60. Obesity: Weight reduction stressed diet emphasized.  Risks of obesity explained.  He promises to do better. Patient will be seen in follow-up appointment in 6 months or earlier if the patient has any concerns.    Medication Adjustments/Labs and Tests Ordered: Current medicines are reviewed at length with the patient today.  Concerns regarding medicines are outlined above.  No orders of the defined types were placed in this encounter.  No orders of the defined types were placed in this encounter.    No chief complaint on file.    History of Present Illness:    Nicholas Love is a 68 y.o. male.  Patient has past medical history of coronary artery disease, essential hypertension, mixed dyslipidemia.  She denies any problems at this time and takes care of activities of daily living.  No chest pain orthopnea or PND.  He has significant orthopedic issues.  That is the reason why he leads a  sedentary lifestyle.  His pain is better but still persisting.  He is on pain medicines.  His pedal edema is better with diuretic.  Past Medical History:  Diagnosis Date   Angina pectoris (HCC)    Arthritis    Asthma    Barrett esophagus    Bilateral primary osteoarthritis of knee 05/16/2021   Bronchiectasis (HCC)    CAD (coronary artery disease)    a. prior mild RCAx2 and LAD stenting. b. same day PCI DES to dRCA 12/2017, 100% occ oD1 with L-L collaterals, otherwise nonobstructive disease, EF normal.   Chronic obstructive pulmonary disease (HCC) 12/17/2016   Formatting of this note might be different from the original. PFTs 01/30/17 moderate obstruction Ratio 69%, FEV1 76%, FVC 85%   Chronic pain of right knee 06/20/2021   Last Assessment & Plan:  Formatting of this note might be different from the original. Review of imaging notes severe degenerative changes and MRI identified insufficiency fractures of the femoral condyle.  He is following with Orthopedics and considering total knee arthroplasty.   Chronic, continuous use of opioids 10/17/2021   Last Assessment & Plan:  Formatting of this note might be different from the original. Patient and I have discussed the hazardous effects of continued opiate pain medication usage. Risks and benefits of above medications including but not limited to possibility of respiratory depression, sedation, and even death were discussed with the patient who expressed an understanding.  Patient did not displ   COPD (chronic obstructive pulmonary disease) (HCC)    Coronary artery disease    DDD (degenerative disc disease), cervical 06/20/2021   Last Assessment & Plan:  Formatting of this note might be different from the original. Cervical imaging shows vertebral body height maintained, approximately 2 mm anterolisthesis of C5 on 6.  There is moderate degenerative disc height loss at C5-6.  Slight bony neuroforaminal narrowing more advanced on the right.  At this  time low back and right knee pain or more limiting complaints.   DDD (degenerative disc disease), lumbar 06/20/2021   Last Assessment & Plan:  Formatting of this note might be different from the original. See lumbar post laminectomy plan   Essential hypertension 01/17/2018   GERD (gastroesophageal reflux disease)    Hiatal hernia    Hyperlipemia    Hypertension    Lumbar post-laminectomy syndrome 06/20/2021   Last Assessment & Plan:  Formatting of this note might be different from the original. This is a 68 year old male who is new to the clinic today with chronic low back pain, status post lumbosacral fusion.  Review of lumbar imaging identifies some adjacent segment degeneration with notable disc height loss at L1-2 level, above his previous L2 to the sacrum fusion.    Today we will take over his chr   Mixed dyslipidemia 01/17/2018   Myocardial infarct [facility])    Other chronic pain 12/10/2021   Pain medication agreement 06/20/2021   Last Assessment & Plan:  Formatting of this note might be different from the original. Agreement signed today.  Narcan prescribed.   Sleep apnea    SOB (shortness of breath)    Unstable angina (HCC) 05/22/2021    Past Surgical History:  Procedure Laterality Date   arthroscopic knee     BACK SURGERY     x3 lower back   CARDIAC CATHETERIZATION     3 stents   CORONARY BALLOON ANGIOPLASTY N/A 05/22/2021   Procedure: CORONARY BALLOON ANGIOPLASTY;  Surgeon: Lennette Bihari, MD;  Location: [facility] INVASIVE CV LAB;  Service: Cardiovascular;  Laterality: N/A;  PTCA   ABORTED   CORONARY BALLOON ANGIOPLASTY N/A 05/23/2021   Procedure: CORONARY BALLOON ANGIOPLASTY;  Surgeon: Lennette Bihari, MD;  Location: [facility] INVASIVE CV LAB;  Service: Cardiovascular;  Laterality: N/A;   CORONARY STENT INTERVENTION N/A 12/20/2017   Procedure: CORONARY STENT INTERVENTION;  Surgeon: Swaziland, Peter M, MD;  Location: [facility] INVASIVE CV LAB;  Service: Cardiovascular;  Laterality: N/A;   FRACTURE  SURGERY  1970's   ankle   LEFT HEART CATH AND CORONARY ANGIOGRAPHY N/A 12/20/2017   Procedure: LEFT HEART CATH AND CORONARY ANGIOGRAPHY;  Surgeon: Swaziland, Peter M, MD;  Location: [facility] INVASIVE CV LAB;  Service: Cardiovascular;  Laterality: N/A;   LEFT HEART CATH AND CORONARY ANGIOGRAPHY N/A 05/22/2021   Procedure: LEFT HEART CATH AND CORONARY ANGIOGRAPHY;  Surgeon: Lennette Bihari, MD;  Location: [facility] INVASIVE CV LAB;  Service: Cardiovascular;  Laterality: N/A;    Current Medications: Current Meds  Medication Sig   amLODipine (NORVASC) 5 MG tablet Take 2 tablets (10 mg total) by mouth daily.   aspirin EC 81 MG tablet Take 81 mg by mouth every evening.    Budeson-Glycopyrrol-Formoterol (BREZTRI AEROSPHERE) 160-9-4.8 MCG/ACT AERO Inhale 2 puffs into the lungs 2 (two) times daily.   etanercept (ENBREL) 50 MG/ML injection Inject 50 mg into the skin once a week.   ferrous sulfate 325 (65 FE) MG EC tablet Take 325 mg  by mouth daily with breakfast.   folic acid (FOLVITE) 800 MCG tablet Take 400 mcg by mouth 2 (two) times daily.   indomethacin (INDOCIN) 50 MG capsule Take 50 mg by mouth 2 (two) times daily with a meal.   metFORMIN (GLUCOPHAGE) 500 MG tablet Take 500 mg by mouth daily with breakfast.   methotrexate (RHEUMATREX) 2.5 MG tablet Take 6 tablets by mouth once a week. Caution:Chemotherapy. Protect from light.   metoprolol succinate (TOPROL-XL) 100 MG 24 hr tablet Take 100 mg by mouth in the morning and at bedtime. Take with or immediately following a meal.   mirtazapine (REMERON) 15 MG tablet Take 15 mg by mouth at bedtime.   nitroGLYCERIN (NITROSTAT) 0.4 MG SL tablet Place 0.4 mg under the tongue every 5 (five) minutes x 3 doses as needed for chest pain.   Oxycodone HCl 10 MG TABS Take 10 mg by mouth every 4 (four) hours as needed for pain.   pantoprazole (PROTONIX) 40 MG tablet Take 40 mg by mouth in the morning.   predniSONE (DELTASONE) 10 MG tablet Take 10-20 mg by mouth as needed.    rosuvastatin (CRESTOR) 40 MG tablet Take 40 mg by mouth every evening.    terbinafine (LAMISIL) 250 MG tablet Take 250 mg by mouth daily.   tiZANidine (ZANAFLEX) 4 MG tablet Take 4 mg by mouth at bedtime.      Allergies:   Ceftriaxone, Cefdinir, Ceftin [cefuroxime], and Ciprofloxacin   Social History   Socioeconomic History   Marital status: Married    Spouse name: Not on file   Number of children: Not on file   Years of education: Not on file   Highest education level: Not on file  Occupational History   Not on file  Tobacco Use   Smoking status: Never   Smokeless tobacco: Never  Substance and Sexual Activity   Alcohol use: Not on file   Drug use: Not on file   Sexual activity: Not on file  Other Topics Concern   Not on file  Social History Narrative   Not on file   Social Drivers of Health   Financial Resource Strain: Not on file  Food Insecurity: Not on file  Transportation Needs: Not on file  Physical Activity: Not on file  Stress: Not on file  Social Connections: Not on file     Family History: The patient's Family history is unknown by patient.  ROS:   Please see the history of present illness.    All other systems reviewed and are negative.  EKGs/Labs/Other Studies Reviewed:    The following studies were reviewed today: I discussed my findings with the patient at length   Recent Labs: No results found for requested labs within last 365 days.  Recent Lipid Panel    Component Value Date/Time   CHOL 133 05/24/2021 0220   CHOL 184 01/17/2018 0923   TRIG 238 (H) 05/24/2021 0220   HDL 32 (L) 05/24/2021 0220   HDL 42 01/17/2018 0923   CHOLHDL 4.2 05/24/2021 0220   VLDL 48 (H) 05/24/2021 0220   LDLCALC 53 05/24/2021 0220   LDLCALC 117 (H) 01/17/2018 0923    Physical Exam:    VS:  BP 138/64   Pulse (!) 56   Ht 5' 10.6" (1.793 m)   Wt 225 lb 6.4 oz (102.2 kg)   SpO2 94%   BMI 31.79 kg/m     Wt Readings from Last 3 Encounters:  03/11/24 225  lb 6.4 oz (  102.2 kg)  08/29/23 229 lb 9.6 oz (104.1 kg)  08/23/23 233 lb 12.8 oz (106.1 kg)     GEN: Patient is in no acute distress HEENT: Normal NECK: No JVD; No carotid bruits LYMPHATICS: No lymphadenopathy CARDIAC: Hear sounds regular, 2/6 systolic murmur at the apex. RESPIRATORY:  Clear to auscultation without rales, wheezing or rhonchi  ABDOMEN: Soft, non-tender, non-distended MUSCULOSKELETAL:  No edema; No deformity  SKIN: Warm and dry NEUROLOGIC:  Alert and oriented x 3 PSYCHIATRIC:  Normal affect   Signed, Garwin Brothers, MD  03/11/2024 8:53 AM    [facility]

## 2024-03-11 NOTE — Patient Instructions (Signed)
 Medication Instructions:  Your physician recommends that you continue on your current medications as directed. Please refer to the Current Medication list given to you today.  *If you need a refill on your cardiac medications before your next appointment, please call your pharmacy*   Lab Work: None Ordered If you have labs (blood work) drawn today and your tests are completely normal, you will receive your results only by: MyChart Message (if you have MyChart) OR A paper copy in the mail If you have any lab test that is abnormal or we need to change your treatment, we will call you to review the results.   Testing/Procedures: None Ordered   Follow-Up: At [facility], you and your health needs are our priority.  As part of our continuing mission to provide you with exceptional heart care, we have created designated Provider Care Teams.  These Care Teams include your primary Cardiologist (physician) and Advanced Practice Providers (APPs -  Physician Assistants and Nurse Practitioners) who all work together to provide you with the care you need, when you need it.  We recommend signing up for the patient portal called "MyChart".  Sign up information is provided on this After Visit Summary.  MyChart is used to connect with patients for Virtual Visits (Telemedicine).  Patients are able to view lab/test results, encounter notes, upcoming appointments, etc.  Non-urgent messages can be sent to your provider as well.   To learn more about what you can do with MyChart, go to [email or web address].    Your next appointment:   9 month follow up

## 2024-03-30 ENCOUNTER — Other Ambulatory Visit: Payer: Self-pay | Admitting: Cardiology

## 2024-03-30 NOTE — Telephone Encounter (Signed)
 Prescription sent to pharmacy.

## 2024-04-09 DIAGNOSIS — M069 Rheumatoid arthritis, unspecified: Secondary | ICD-10-CM | POA: Diagnosis not present

## 2024-04-09 DIAGNOSIS — Z6830 Body mass index (BMI) 30.0-30.9, adult: Secondary | ICD-10-CM | POA: Diagnosis not present

## 2024-04-09 DIAGNOSIS — J449 Chronic obstructive pulmonary disease, unspecified: Secondary | ICD-10-CM | POA: Diagnosis not present

## 2024-05-14 DIAGNOSIS — E1122 Type 2 diabetes mellitus with diabetic chronic kidney disease: Secondary | ICD-10-CM | POA: Diagnosis not present

## 2024-05-14 DIAGNOSIS — Z1331 Encounter for screening for depression: Secondary | ICD-10-CM | POA: Diagnosis not present

## 2024-05-14 DIAGNOSIS — E559 Vitamin D deficiency, unspecified: Secondary | ICD-10-CM | POA: Diagnosis not present

## 2024-05-14 DIAGNOSIS — Z9181 History of falling: Secondary | ICD-10-CM | POA: Diagnosis not present

## 2024-05-14 DIAGNOSIS — M069 Rheumatoid arthritis, unspecified: Secondary | ICD-10-CM | POA: Diagnosis not present

## 2024-05-14 DIAGNOSIS — I2510 Atherosclerotic heart disease of native coronary artery without angina pectoris: Secondary | ICD-10-CM | POA: Diagnosis not present

## 2024-05-14 DIAGNOSIS — E782 Mixed hyperlipidemia: Secondary | ICD-10-CM | POA: Diagnosis not present

## 2024-05-14 DIAGNOSIS — J449 Chronic obstructive pulmonary disease, unspecified: Secondary | ICD-10-CM | POA: Diagnosis not present

## 2024-05-14 DIAGNOSIS — K219 Gastro-esophageal reflux disease without esophagitis: Secondary | ICD-10-CM | POA: Diagnosis not present

## 2024-05-14 DIAGNOSIS — I129 Hypertensive chronic kidney disease with stage 1 through stage 4 chronic kidney disease, or unspecified chronic kidney disease: Secondary | ICD-10-CM | POA: Diagnosis not present

## 2024-06-29 DIAGNOSIS — Z5181 Encounter for therapeutic drug level monitoring: Secondary | ICD-10-CM | POA: Diagnosis not present

## 2024-06-29 DIAGNOSIS — M961 Postlaminectomy syndrome, not elsewhere classified: Secondary | ICD-10-CM | POA: Diagnosis not present

## 2024-06-30 DIAGNOSIS — B351 Tinea unguium: Secondary | ICD-10-CM | POA: Diagnosis not present

## 2024-06-30 DIAGNOSIS — Z6831 Body mass index (BMI) 31.0-31.9, adult: Secondary | ICD-10-CM | POA: Diagnosis not present

## 2024-06-30 DIAGNOSIS — M069 Rheumatoid arthritis, unspecified: Secondary | ICD-10-CM | POA: Diagnosis not present

## 2024-08-04 DIAGNOSIS — S91301A Unspecified open wound, right foot, initial encounter: Secondary | ICD-10-CM | POA: Diagnosis not present

## 2024-08-04 DIAGNOSIS — Z6830 Body mass index (BMI) 30.0-30.9, adult: Secondary | ICD-10-CM | POA: Diagnosis not present

## 2024-08-04 DIAGNOSIS — M069 Rheumatoid arthritis, unspecified: Secondary | ICD-10-CM | POA: Diagnosis not present

## 2024-08-04 DIAGNOSIS — L03115 Cellulitis of right lower limb: Secondary | ICD-10-CM | POA: Diagnosis not present

## 2024-08-13 DIAGNOSIS — Z6830 Body mass index (BMI) 30.0-30.9, adult: Secondary | ICD-10-CM | POA: Diagnosis not present

## 2024-08-13 DIAGNOSIS — S91301A Unspecified open wound, right foot, initial encounter: Secondary | ICD-10-CM | POA: Diagnosis not present

## 2024-08-13 DIAGNOSIS — S91301D Unspecified open wound, right foot, subsequent encounter: Secondary | ICD-10-CM | POA: Diagnosis not present

## 2024-08-13 DIAGNOSIS — M069 Rheumatoid arthritis, unspecified: Secondary | ICD-10-CM | POA: Diagnosis not present

## 2024-12-11 NOTE — Progress Notes (Signed)
 Nicholas Love                                          MRN: [id number]   12/11/2024   The VBCI Quality Team Specialist reviewed this patient medical record for the purposes of chart review for care gap closure. The following were reviewed: chart review for care gap closure-kidney health evaluation for diabetes:eGFR  and uACR.    VBCI Quality Team

## 2024-12-15 ENCOUNTER — Ambulatory Visit: Admitting: Cardiology

## 2024-12-25 ENCOUNTER — Other Ambulatory Visit: Payer: Self-pay | Admitting: Cardiology

## 2024-12-29 ENCOUNTER — Ambulatory Visit: Attending: Cardiology | Admitting: Cardiology

## 2024-12-29 ENCOUNTER — Encounter: Payer: Self-pay | Admitting: Cardiology

## 2024-12-29 VITALS — BP 140/76 | HR 57 | Ht 72.0 in | Wt 231.2 lb

## 2024-12-29 DIAGNOSIS — I350 Nonrheumatic aortic (valve) stenosis: Secondary | ICD-10-CM | POA: Diagnosis not present

## 2024-12-29 DIAGNOSIS — G4730 Sleep apnea, unspecified: Secondary | ICD-10-CM | POA: Diagnosis not present

## 2024-12-29 DIAGNOSIS — I2510 Atherosclerotic heart disease of native coronary artery without angina pectoris: Secondary | ICD-10-CM | POA: Diagnosis not present

## 2024-12-29 DIAGNOSIS — E782 Mixed hyperlipidemia: Secondary | ICD-10-CM | POA: Diagnosis not present

## 2024-12-29 DIAGNOSIS — I10 Essential (primary) hypertension: Secondary | ICD-10-CM

## 2024-12-29 NOTE — Progress Notes (Signed)
 " Cardiology Office Note:    Date:  12/29/2024   ID:  Nicholas Love, DOB January 14, 1956, MRN [id number]  PCP:  Pandora Therisa RAMAN, NP  Cardiologist:  Jennifer JONELLE Crape, MD   Referring MD: Pandora Therisa RAMAN, NP    ASSESSMENT:    1. Essential hypertension   2. Mixed dyslipidemia   3. Coronary artery disease involving native coronary artery of native heart without angina pectoris   4. Sleep apnea, unspecified type   5. Mild aortic stenosis    PLAN:    In order of problems listed above:  Coronary artery disease: Secondary prevention stressed with the patient.  Importance of compliance with diet medication stressed and patient verbalized standing. He was advised to ambulate to the best of his ability.  He has rheumatoid arthritis and significant pain issues. Essential hypertension: Blood pressure is stable and diet was emphasized.  His blood pressure is stable at home.  Lifestyle modification and salt intake issues discussed. Mild aortic stenosis: Will continue to monitor and do an echocardiogram. Mixed dyslipidemia: On lipid-lowering medications followed by primary care.  Lipids not at goal.  Medications may need to be titrated.  He tells me he will have blood work with primary care in a month.  LDL less than 60. Obesity: Weight reduction stressed diet emphasized and he promises to do better.  Risks of obesity explained. Patient will be seen in follow-up appointment in 6 months or earlier if the patient has any concerns.    Medication Adjustments/Labs and Tests Ordered: Current medicines are reviewed at length with the patient today.  Concerns regarding medicines are outlined above.  Orders Placed This Encounter  Procedures   EKG 12-Lead   ECHOCARDIOGRAM COMPLETE   No orders of the defined types were placed in this encounter.    No chief complaint on file.    History of Present Illness:    Nicholas Love is a 69 y.o. male.  Patient has past medical history of coronary artery disease  post stenting, essential hypertension, dyslipidemia and mild aortic stenosis.  He denies any problems at this time and takes care of activities of daily living.  No chest pain orthopnea or PND.  At the time of my evaluation, the patient is alert awake oriented and in no distress.  Past Medical History:  Diagnosis Date   Angina pectoris    Arthritis    Asthma    Barrett esophagus    Bilateral primary osteoarthritis of knee 05/16/2021   Bronchiectasis (HCC)    CAD (coronary artery disease)    a. prior mild RCAx2 and LAD stenting. b. same day PCI DES to dRCA 12/2017, 100% occ oD1 with L-L collaterals, otherwise nonobstructive disease, EF normal.   Chronic obstructive pulmonary disease (HCC) 12/17/2016   Formatting of this note might be different from the original. PFTs 01/30/17 moderate obstruction Ratio 69%, FEV1 76%, FVC 85%   Chronic pain of right knee 06/20/2021   Last Assessment & Plan:  Formatting of this note might be different from the original. Review of imaging notes severe degenerative changes and MRI identified insufficiency fractures of the femoral condyle.  He is following with Orthopedics and considering total knee arthroplasty.   Chronic, continuous use of opioids 10/17/2021   Last Assessment & Plan:  Formatting of this note might be different from the original. Patient and I have discussed the hazardous effects of continued opiate pain medication usage. Risks and benefits of above medications including but not limited to possibility  of respiratory depression, sedation, and even death were discussed with the patient who expressed an understanding.  Patient did not displ   COPD (chronic obstructive pulmonary disease) (HCC)    Coronary artery disease    DDD (degenerative disc disease), cervical 06/20/2021   Last Assessment & Plan:  Formatting of this note might be different from the original. Cervical imaging shows vertebral body height maintained, approximately 2 mm anterolisthesis of  C5 on 6.  There is moderate degenerative disc height loss at C5-6.  Slight bony neuroforaminal narrowing more advanced on the right.  At this time low back and right knee pain or more limiting complaints.   DDD (degenerative disc disease), lumbar 06/20/2021   Last Assessment & Plan:  Formatting of this note might be different from the original. See lumbar post laminectomy plan   Essential hypertension 01/17/2018   GERD (gastroesophageal reflux disease)    Hiatal hernia    Hyperlipemia    Hypertension    Lumbar post-laminectomy syndrome 06/20/2021   Last Assessment & Plan:  Formatting of this note might be different from the original. This is a 69 year old male who is new to the clinic today with chronic low back pain, status post lumbosacral fusion.  Review of lumbar imaging identifies some adjacent segment degeneration with notable disc height loss at L1-2 level, above his previous L2 to the sacrum fusion.    Today we will take over his chr   Mixed dyslipidemia 01/17/2018   Myocardial infarct (HCC)    Obesity (BMI 30.0-34.9) 03/11/2024   Other chronic pain 12/10/2021   Pain medication agreement 06/20/2021   Last Assessment & Plan:  Formatting of this note might be different from the original. Agreement signed today.  Narcan prescribed.   Sleep apnea    SOB (shortness of breath)    Unstable angina (HCC) 05/22/2021    Past Surgical History:  Procedure Laterality Date   arthroscopic knee     BACK SURGERY     x3 lower back   CARDIAC CATHETERIZATION     3 stents   CORONARY BALLOON ANGIOPLASTY N/A 05/22/2021   Procedure: CORONARY BALLOON ANGIOPLASTY;  Surgeon: Burnard Debby LABOR, MD;  Location: [facility] INVASIVE CV LAB;  Service: Cardiovascular;  Laterality: N/A;  PTCA   ABORTED   CORONARY BALLOON ANGIOPLASTY N/A 05/23/2021   Procedure: CORONARY BALLOON ANGIOPLASTY;  Surgeon: Burnard Debby LABOR, MD;  Location: [facility] INVASIVE CV LAB;  Service: Cardiovascular;  Laterality: N/A;   CORONARY STENT INTERVENTION  N/A 12/20/2017   Procedure: CORONARY STENT INTERVENTION;  Surgeon: Jordan, Peter M, MD;  Location: [facility] INVASIVE CV LAB;  Service: Cardiovascular;  Laterality: N/A;   FRACTURE SURGERY  1970's   ankle   LEFT HEART CATH AND CORONARY ANGIOGRAPHY N/A 12/20/2017   Procedure: LEFT HEART CATH AND CORONARY ANGIOGRAPHY;  Surgeon: Jordan, Peter M, MD;  Location: [facility] INVASIVE CV LAB;  Service: Cardiovascular;  Laterality: N/A;   LEFT HEART CATH AND CORONARY ANGIOGRAPHY N/A 05/22/2021   Procedure: LEFT HEART CATH AND CORONARY ANGIOGRAPHY;  Surgeon: Burnard Debby LABOR, MD;  Location: [facility] INVASIVE CV LAB;  Service: Cardiovascular;  Laterality: N/A;    Current Medications: Active Medications[1]   Allergies:   Ceftriaxone, Cefdinir, Ceftin [cefuroxime], and Ciprofloxacin   Social History   Socioeconomic History   Marital status: Married    Spouse name: Not on file   Number of children: Not on file   Years of education: Not on file   Highest education level: Not on file  Occupational  History   Not on file  Tobacco Use   Smoking status: Never   Smokeless tobacco: Never  Substance and Sexual Activity   Alcohol  use: Not on file   Drug use: Not on file   Sexual activity: Not on file  Other Topics Concern   Not on file  Social History Narrative   Not on file   Social Drivers of Health   Tobacco Use: Low Risk (12/29/2024)   Patient History    Smoking Tobacco Use: Never    Smokeless Tobacco Use: Never    Passive Exposure: Not on file  Recent Concern: Tobacco Use - Medium Risk (12/11/2024)   Received from [facility]   Patient History    Smoking Tobacco Use: Former    Smokeless Tobacco Use: Former    Passive Exposure: Not on Stage Manager: Not on [employer] Insecurity: Not on file  Transportation Needs: Not on file  Physical Activity: Not on file  Stress: Not on file  Social Connections: Not on file  Depression (PHQ2-9): Not on file  Alcohol  Screen: Not on file  Housing: Not  on file  Utilities: Not on file  Health Literacy: Not on file     Family History: The patient's Family history is unknown by patient.  ROS:   Please see the history of present illness.    All other systems reviewed and are negative.  EKGs/Labs/Other Studies Reviewed:    The following studies were reviewed today: .SABRAEKG Interpretation Date/Time:  Tuesday December 29 2024 13:37:46 EST Ventricular Rate:  57 PR Interval:  152 QRS Duration:  100 QT Interval:  460 QTC Calculation: 447 R Axis:   -36  Text Interpretation: Sinus bradycardia Left axis deviation Minimal voltage criteria for LVH, may be normal variant ( Cornell product ) When compared with ECG of 23-Aug-2023 16:04, No significant change was found Confirmed by Edwyna Backers [phone]) on 12/29/2024 1:41:33 PM     Recent Labs: No results found for requested labs within last 365 days.  Recent Lipid Panel    Component Value Date/Time   CHOL 133 05/24/2021 0220   CHOL 184 01/17/2018 0923   TRIG 238 (H) 05/24/2021 0220   HDL 32 (L) 05/24/2021 0220   HDL 42 01/17/2018 0923   CHOLHDL 4.2 05/24/2021 0220   VLDL 48 (H) 05/24/2021 0220   LDLCALC 53 05/24/2021 0220   LDLCALC 117 (H) 01/17/2018 0923    Physical Exam:    VS:  BP (!) 140/76   Pulse (!) 57   Ht 6' (1.829 m)   Wt 231 lb 3.2 oz (104.9 kg)   SpO2 94%   BMI 31.36 kg/m     Wt Readings from Last 3 Encounters:  12/29/24 231 lb 3.2 oz (104.9 kg)  03/11/24 225 lb 6.4 oz (102.2 kg)  08/29/23 229 lb 9.6 oz (104.1 kg)     GEN: Patient is in no acute distress HEENT: Normal NECK: No JVD; No carotid bruits LYMPHATICS: No lymphadenopathy CARDIAC: Hear sounds regular, 2/6 systolic murmur at the apex. RESPIRATORY:  Clear to auscultation without rales, wheezing or rhonchi  ABDOMEN: Soft, non-tender, non-distended MUSCULOSKELETAL:  No edema; No deformity  SKIN: Warm and dry NEUROLOGIC:  Alert and oriented x 3 PSYCHIATRIC:  Normal affect   Signed, Backers JONELLE Edwyna, MD  12/29/2024 2:07 PM    [facility]     [1]  Current Meds  Medication Sig   amLODipine  (NORVASC ) 5 MG tablet Take  2 tablets (10 mg total) by mouth daily.   aspirin  EC 81 MG tablet Take 81 mg by mouth every evening.    Budeson-Glycopyrrol-Formoterol (BREZTRI AEROSPHERE) 160-9-4.8 MCG/ACT AERO Inhale 2 puffs into the lungs 2 (two) times daily.   etanercept (ENBREL) 50 MG/ML injection Inject 50 mg into the skin once a week.   ferrous sulfate 325 (65 FE) MG EC tablet Take 325 mg by mouth daily with breakfast.   folic acid  (FOLVITE ) 800 MCG tablet Take 400 mcg by mouth 2 (two) times daily.   furosemide  (LASIX ) 40 MG tablet Take 40 mg by mouth daily.   metFORMIN (GLUCOPHAGE) 500 MG tablet Take 500 mg by mouth daily with breakfast.   metoprolol  succinate (TOPROL -XL) 100 MG 24 hr tablet Take 100 mg by mouth in the morning and at bedtime. Take with or immediately following a meal.   mirtazapine  (REMERON ) 15 MG tablet Take 15 mg by mouth at bedtime.   nitroGLYCERIN  (NITROSTAT ) 0.4 MG SL tablet Place 0.4 mg under the tongue every 5 (five) minutes x 3 doses as needed for chest pain.   Oxycodone  HCl 10 MG TABS Take 10 mg by mouth every 4 (four) hours as needed for pain.   pantoprazole  (PROTONIX ) 40 MG tablet Take 40 mg by mouth in the morning.   rosuvastatin  (CRESTOR ) 40 MG tablet Take 40 mg by mouth every evening.    tiZANidine  (ZANAFLEX ) 4 MG tablet Take 4 mg by mouth at bedtime.    "

## 2024-12-29 NOTE — Patient Instructions (Signed)
 Medication Instructions:  Your physician recommends that you continue on your current medications as directed. Please refer to the Current Medication list given to you today.  *If you need a refill on your cardiac medications before your next appointment, please call your pharmacy*   Lab Work: None ordered If you have labs (blood work) drawn today and your tests are completely normal, you will receive your results only by: MyChart Message (if you have MyChart) OR A paper copy in the mail If you have any lab test that is abnormal or we need to change your treatment, we will call you to review the results.  Testing/Procedures: Your physician has requested that you have an echocardiogram. Echocardiography is a painless test that uses sound waves to create images of your heart. It provides your doctor with information about the size and shape of your heart and how well your heart's chambers and valves are working. This procedure takes approximately one hour. There are no restrictions for this procedure. Please do NOT wear cologne, perfume, aftershave, or lotions (deodorant is allowed). Please arrive 15 minutes prior to your appointment time.  Please note: We ask at that you not bring children with you during ultrasound (echo/ vascular) testing. Due to room size and safety concerns, children are not allowed in the ultrasound rooms during exams. Our front office staff cannot provide observation of children in our lobby area while testing is being conducted. An adult accompanying a patient to their appointment will only be allowed in the ultrasound room at the discretion of the ultrasound technician under special circumstances. We apologize for any inconvenience.  Follow-Up: At [facility], you and your health needs are our priority.  As part of our continuing mission to provide you with exceptional heart care, we have created designated Provider Care Teams.  These Care Teams include your primary  Cardiologist (physician) and Advanced Practice Providers (APPs -  Physician Assistants and Nurse Practitioners) who all work together to provide you with the care you need, when you need it.  We recommend signing up for the patient portal called MyChart.  Sign up information is provided on this After Visit Summary.  MyChart is used to connect with patients for Virtual Visits (Telemedicine).  Patients are able to view lab/test results, encounter notes, upcoming appointments, etc.  Non-urgent messages can be sent to your provider as well.   To learn more about what you can do with MyChart, go to [email or web address].    Your next appointment:   9 month(s)  The format for your next appointment:   In Person  Provider:   Jennifer Crape, MD   Other Instructions Echocardiogram An echocardiogram is a test that uses sound waves (ultrasound) to produce images of the heart. Images from an echocardiogram can provide important information about: Heart size and shape. The size and thickness and movement of your heart's walls. Heart muscle function and strength. Heart valve function or if you have stenosis. Stenosis is when the heart valves are too narrow. If blood is flowing backward through the heart valves (regurgitation). A tumor or infectious growth around the heart valves. Areas of heart muscle that are not working well because of poor blood flow or injury from a heart attack. Aneurysm detection. An aneurysm is a weak or damaged part of an artery wall. The wall bulges out from the normal force of blood pumping through the body. Tell a health care provider about: Any allergies you have. All medicines you are taking, including vitamins, herbs,  eye drops, creams, and over-the-counter medicines. Any blood disorders you have. Any surgeries you have had. Any medical conditions you have. Whether you are pregnant or may be pregnant. What are the risks? Generally, this is a safe test. However,  problems may occur, including an allergic reaction to dye (contrast) that may be used during the test. What happens before the test? No specific preparation is needed. You may eat and drink normally. What happens during the test? You will take off your clothes from the waist up and put on a hospital gown. Electrodes or electrocardiogram (ECG)patches may be placed on your chest. The electrodes or patches are then connected to a device that monitors your heart rate and rhythm. You will lie down on a table for an ultrasound exam. A gel will be applied to your chest to help sound waves pass through your skin. A handheld device, called a transducer, will be pressed against your chest and moved over your heart. The transducer produces sound waves that travel to your heart and bounce back (or echo back) to the transducer. These sound waves will be captured in real-time and changed into images of your heart that can be viewed on a video monitor. The images will be recorded on a computer and reviewed by your health care provider. You may be asked to change positions or hold your breath for a short time. This makes it easier to get different views or better views of your heart. In some cases, you may receive contrast through an IV in one of your veins. This can improve the quality of the pictures from your heart. The procedure may vary among health care providers and hospitals.   What can I expect after the test? You may return to your normal, everyday life, including diet, activities, and medicines, unless your health care provider tells you not to do that. Follow these instructions at home: It is up to you to get the results of your test. Ask your health care provider, or the department that is doing the test, when your results will be ready. Keep all follow-up visits. This is important. Summary An echocardiogram is a test that uses sound waves (ultrasound) to produce images of the heart. Images from an  echocardiogram can provide important information about the size and shape of your heart, heart muscle function, heart valve function, and other possible heart problems. You do not need to do anything to prepare before this test. You may eat and drink normally. After the echocardiogram is completed, you may return to your normal, everyday life, unless your health care provider tells you not to do that. This information is not intended to replace advice given to you by your health care provider. Make sure you discuss any questions you have with your health care provider. Document Revised: 07/19/2020 Document Reviewed: 07/19/2020 [employer] Patient Education  2021 [employer]   Important Information About Sugar

## 2025-01-08 ENCOUNTER — Other Ambulatory Visit: Payer: Self-pay | Admitting: Cardiology

## 2025-01-20 ENCOUNTER — Ambulatory Visit
